# Patient Record
Sex: Male | Born: 1980 | Race: Black or African American | Hispanic: No | Marital: Single | Smoking: Never smoker
Health system: Southern US, Community
[De-identification: ages and names within clinical notes are randomized; demographics above are authoritative.]

## PROBLEM LIST (undated history)

## (undated) DIAGNOSIS — J40 Bronchitis, not specified as acute or chronic: Secondary | ICD-10-CM

---

## 2011-07-13 ENCOUNTER — Emergency Department (HOSPITAL_BASED_OUTPATIENT_CLINIC_OR_DEPARTMENT_OTHER)
Admission: EM | Admit: 2011-07-13 | Discharge: 2011-07-13 | Disposition: A | Discharge status: Home / Self Care | Payer: No Typology Code available for payment source | Attending: Emergency Medicine | Admitting: Emergency Medicine

## 2011-07-13 DIAGNOSIS — Y92410 Unspecified street and highway as the place of occurrence of the external cause: Secondary | ICD-10-CM | POA: Insufficient documentation

## 2011-07-13 DIAGNOSIS — S339XXA Sprain of unspecified parts of lumbar spine and pelvis, initial encounter: Secondary | ICD-10-CM | POA: Insufficient documentation

## 2011-07-13 DIAGNOSIS — S39012A Strain of muscle, fascia and tendon of lower back, initial encounter: Secondary | ICD-10-CM

## 2011-07-13 MED ORDER — CYCLOBENZAPRINE HCL 10 MG PO TABS: 10.0000 mg | ORAL_TABLET | Freq: Three times a day (TID) | ORAL | Status: AC | PRN

## 2011-07-13 MED ORDER — NAPROXEN 500 MG PO TABS: 500.0000 mg | ORAL_TABLET | Freq: Two times a day (BID) | ORAL | Status: AC

## 2011-07-13 MED ORDER — OXYCODONE-ACETAMINOPHEN 5-325 MG PO TABS
2.0000 | ORAL_TABLET | Freq: Once | ORAL | Status: AC
  Administered 2011-07-13: 2 via ORAL
  Filled 2011-07-13: qty 2

## 2011-07-13 MED ORDER — OXYCODONE-ACETAMINOPHEN 5-325 MG PO TABS: 1.0000 | ORAL_TABLET | Freq: Four times a day (QID) | ORAL | Status: AC | PRN

## 2011-07-13 MED ORDER — IBUPROFEN 800 MG PO TABS
800.0000 mg | ORAL_TABLET | Freq: Once | ORAL | Status: AC
  Administered 2011-07-13: 800 mg via ORAL
  Filled 2011-07-13: qty 1
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
## 2014-07-14 ENCOUNTER — Emergency Department (HOSPITAL_BASED_OUTPATIENT_CLINIC_OR_DEPARTMENT_OTHER)
Admission: EM | Admit: 2014-07-14 | Discharge: 2014-07-14 | Disposition: A | Discharge status: Home / Self Care | Payer: No Typology Code available for payment source | Attending: Emergency Medicine | Admitting: Emergency Medicine

## 2014-07-14 ENCOUNTER — Encounter (HOSPITAL_BASED_OUTPATIENT_CLINIC_OR_DEPARTMENT_OTHER): Payer: Self-pay | Admitting: *Deleted

## 2014-07-14 DIAGNOSIS — J358 Other chronic diseases of tonsils and adenoids: Secondary | ICD-10-CM

## 2014-07-14 DIAGNOSIS — H6692 Otitis media, unspecified, left ear: Secondary | ICD-10-CM | POA: Insufficient documentation

## 2014-07-14 MED ORDER — AMOXICILLIN 500 MG PO CAPS: 500.0000 mg | ORAL_CAPSULE | Freq: Three times a day (TID) | ORAL | Status: DC

## 2014-07-14 NOTE — ED Provider Notes (Signed)
CSN: [id number]     Arrival date & time 07/14/14  0941 History   First MD Initiated Contact with Patient 07/14/14 [phone]     Chief Complaint  Patient presents with  . Sore Throat     (Consider location/radiation/quality/duration/timing/severity/associated sxs/prior Treatment) Patient is a 33 y.o. male presenting with pharyngitis. The history is provided by the patient.  Sore Throat This is a new problem. The current episode started 6 to 12 hours ago. The problem occurs constantly. The problem has not changed since onset.Associated symptoms comments: Left ear pain.  No fever, cough or sinus drainage. The symptoms are aggravated by swallowing. Nothing relieves the symptoms. He has tried nothing for the symptoms. The treatment provided no relief.    History reviewed. No pertinent past medical history. History reviewed. No pertinent past surgical history. No family history on file. History  Substance Use Topics  . Smoking status: Never Smoker   . Smokeless tobacco: Never Used  . Alcohol Use: Yes     Comment: occasional    Review of Systems  All other systems reviewed and are negative.     Allergies  Review of patient's allergies indicates no known allergies.  Home Medications   Prior to Admission medications   Medication Sig Start Date End Date Taking? Authorizing Provider  amoxicillin (AMOXIL) 500 MG capsule Take 1 capsule (500 mg total) by mouth 3 (three) times daily. 07/14/14   Gwyneth SproutWhitney Quadasia Newsham, MD   BP 131/80 mmHg  Temp(Src) 98.9 F (37.2 C) (Oral)  Resp 18  Ht 5\' 10"  (1.778 m)  Wt 226 lb (102.513 kg)  BMI 32.43 kg/m2  SpO2 98% Physical Exam  Constitutional: He is oriented to person, place, and time. He appears well-developed and well-nourished. No distress.  HENT:  Head: Normocephalic and atraumatic.  Right Ear: Tympanic membrane and ear canal normal.  Left Ear: Ear canal normal. Tympanic membrane is injected, erythematous and bulging. Tympanic membrane is not  perforated. A middle ear effusion is present.  Mouth/Throat: Mucous membranes are normal. No oropharyngeal exudate, posterior oropharyngeal edema or tonsillar abscesses.    Eyes: EOM are normal. Pupils are equal, round, and reactive to light.  Cardiovascular: Normal rate.   Pulmonary/Chest: Effort normal.  Neurological: He is alert and oriented to person, place, and time.  Skin: Skin is warm and dry.  Psychiatric: He has a normal mood and affect. His behavior is normal.  Nursing note and vitals reviewed.   ED Course  Procedures (including critical care time) Labs Review Labs Reviewed - No data to display  Imaging Review No results found.   EKG Interpretation None      MDM   Final diagnoses:  Acute left otitis media, recurrence not specified, unspecified otitis media type  Symptomatic tonsillar crypt    Patient with left-sided ear and throat pain that started today. He has evidence of left otitis media with erythema of the left. Takes. Also patient has a tonsillar crypt stone that was removed with a curette.  Patient started on amoxicillin   Gwyneth SproutWhitney Yunus Stoklosa, MD 07/14/14 1009

## 2014-07-14 NOTE — ED Notes (Signed)
Sore throat, left side, since waking- noticed white spot on back of throat- also c/o left ear pain

## 2014-07-14 NOTE — ED Notes (Signed)
D/c home with RX x 1 for amoxicillin

## 2014-08-15 ENCOUNTER — Emergency Department (HOSPITAL_BASED_OUTPATIENT_CLINIC_OR_DEPARTMENT_OTHER)
Admission: EM | Admit: 2014-08-15 | Discharge: 2014-08-15 | Disposition: A | Discharge status: Home / Self Care | Payer: No Typology Code available for payment source | Attending: Emergency Medicine | Admitting: Emergency Medicine

## 2014-08-15 ENCOUNTER — Encounter (HOSPITAL_BASED_OUTPATIENT_CLINIC_OR_DEPARTMENT_OTHER): Payer: Self-pay | Admitting: Emergency Medicine

## 2014-08-15 DIAGNOSIS — S0501XA Injury of conjunctiva and corneal abrasion without foreign body, right eye, initial encounter: Secondary | ICD-10-CM

## 2014-08-15 DIAGNOSIS — Y9389 Activity, other specified: Secondary | ICD-10-CM | POA: Insufficient documentation

## 2014-08-15 DIAGNOSIS — H15001 Unspecified scleritis, right eye: Secondary | ICD-10-CM

## 2014-08-15 DIAGNOSIS — X58XXXA Exposure to other specified factors, initial encounter: Secondary | ICD-10-CM | POA: Insufficient documentation

## 2014-08-15 DIAGNOSIS — H15099 Other scleritis, unspecified eye: Secondary | ICD-10-CM | POA: Insufficient documentation

## 2014-08-15 DIAGNOSIS — Y9289 Other specified places as the place of occurrence of the external cause: Secondary | ICD-10-CM | POA: Insufficient documentation

## 2014-08-15 DIAGNOSIS — Y998 Other external cause status: Secondary | ICD-10-CM | POA: Insufficient documentation

## 2014-08-15 DIAGNOSIS — Z792 Long term (current) use of antibiotics: Secondary | ICD-10-CM | POA: Insufficient documentation

## 2014-08-15 MED ORDER — FLUORESCEIN SODIUM 1 MG OP STRP
1.0000 | ORAL_STRIP | Freq: Once | OPHTHALMIC | Status: AC
  Administered 2014-08-15: 1 via OPHTHALMIC
  Filled 2014-08-15: qty 1

## 2014-08-15 MED ORDER — ERYTHROMYCIN 5 MG/GM OP OINT: TOPICAL_OINTMENT | OPHTHALMIC | Status: DC

## 2014-08-15 MED ORDER — TETRACAINE HCL 0.5 % OP SOLN
2.0000 [drp] | Freq: Once | OPHTHALMIC | Status: AC
  Administered 2014-08-15: 2 [drp] via OPHTHALMIC
  Filled 2014-08-15: qty 2

## 2014-08-15 NOTE — ED Notes (Signed)
Right eye has been feeling "Ginger Organtrashy" for a couple days.  Not pain or itching but "irritation".  Sclera is red laterally.  No known injury.  Does not wear contacts.

## 2014-08-15 NOTE — Discharge Instructions (Signed)
Corneal Abrasion The cornea is the clear covering at the front and center of the eye. When looking at the colored portion of the eye (iris), you are looking through the cornea. This very thin tissue is made up of many layers. The surface layer is a single layer of cells (corneal epithelium) and is one of the most sensitive tissues in the body. If a scratch or injury causes the corneal epithelium to come off, it is called a corneal abrasion. If the injury extends to the tissues below the epithelium, the condition is called a corneal ulcer. CAUSES   Scratches.  Trauma.  Foreign body in the eye. Some people have recurrences of abrasions in the area of the original injury even after it has healed (recurrent erosion syndrome). Recurrent erosion syndrome generally improves and goes away with time. SYMPTOMS   Eye pain.  Difficulty or inability to keep the injured eye open.  The eye becomes very sensitive to light.  Recurrent erosions tend to happen suddenly, first thing in the morning, usually after waking up and opening the eye. DIAGNOSIS  Your health care provider can diagnose a corneal abrasion during an eye exam. Dye is usually placed in the eye using a drop or a small paper strip moistened by your tears. When the eye is examined with a special light, the abrasion shows up clearly because of the dye. TREATMENT   Small abrasions may be treated with antibiotic drops or ointment alone.  A pressure patch may be put over the eye. If this is done, follow your doctor's instructions for when to remove the patch. Do not drive or use machines while the eye patch is on. Judging distances is hard to do with a patch on. If the abrasion becomes infected and spreads to the deeper tissues of the cornea, a corneal ulcer can result. This is serious because it can cause corneal scarring. Corneal scars interfere with light passing through the cornea and cause a loss of vision in the involved eye. HOME CARE  INSTRUCTIONS  Use medicine or ointment as directed. Only take over-the-counter or prescription medicines for pain, discomfort, or fever as directed by your health care provider.  Do not drive or operate machinery if your eye is patched. Your ability to judge distances is impaired.  If your health care provider has given you a follow-up appointment, it is very important to keep that appointment. Not keeping the appointment could result in a severe eye infection or permanent loss of vision. If there is any problem keeping the appointment, let your health care provider know. SEEK MEDICAL CARE IF:   You have pain, light sensitivity, and a scratchy feeling in one eye or both eyes.  Your pressure patch keeps loosening up, and you can blink your eye under the patch after treatment.  Any kind of discharge develops from the eye after treatment or if the lids stick together in the morning.  You have the same symptoms in the morning as you did with the original abrasion days, weeks, or months after the abrasion healed. MAKE SURE YOU:   Understand these instructions.  Will watch your condition.  Will get help right away if you are not doing well or get worse. Document Released: 08/07/2000 Document Revised: 08/15/2013 Document Reviewed: 04/17/2013 [facility] Patient Information 2015 [place], [place]. This information is not intended to replace advice given to you by your health care provider. Make sure you discuss any questions you have with your health care provider.   Scleritis  and Episcleritis The outer part of the eyeball is covered with a tough fibrous covering called the sclera. It is the white part of the eye. This tough covering also has a thin membrane lying on top of it called the episclera.   When the sclera becomes red and sore (inflamed), it is called scleritis.  When the episclera becomes inflamed, it is called episcleritis. CAUSES   Scleritis is usually more severe and is associated  with autoimmune diseases such as:  Rheumatoid arthritis.  Inflammations of the bowel such as Crohn's Disease (regional enteritis).  Ulcerative colitis.  Episcleritis usually has no known cause. SYMPTOMS  Both scleritis and episcleritis cause red patches or a nodule on the eye. DIAGNOSIS  This condition should be examined by an ophthalmologist. This is because very strong medications that have side effects to the body and eye may be required to treat severe attacks. Further investigations into the patient's general health may be necessary. TREATMENT   Episcleritis tends to get better without treatment within a week or two.  Scleritis is more severe. Often, your caregiver will prescribe steroids by mouth (orally) or as drops in the eye. This treatment helps lessen the redness and soreness (inflammation). HOME CARE INSTRUCTIONS   Take all medications as directed.  Keep your follow-up appointments as directed.  Avoid irritation of the involved eye(s).  Stop using hard or soft contact lenses until your caregiver tells you that it is safe to use them. SEEK MEDICAL CARE IF:   Redness or irritation gets worse.  You develop pain or sensitivity to light.  You develop any change in vision in the involved eye(s). Document Released: 08/04/2001 Document Revised: 11/02/2011 Document Reviewed: 12/06/2008 [facility] Patient Information 2015 [place], [place]. This information is not intended to replace advice given to you by your health care provider. Make sure you discuss any questions you have with your health care provider.

## 2014-08-15 NOTE — ED Provider Notes (Signed)
CSN: [id number]     Arrival date & time 08/15/14  2202 History   This chart was scribed for Anthony CamelScott T Rane Dumm, MD by Freida Busmaniana Omoyeni, ED Scribe. This patient was seen in room MH07/MH07 and the patient's care was started 10:49 PM.    Chief Complaint  Patient presents with  . Eye Problem      The history is provided by the patient. No language interpreter was used.     HPI Comments:  Anthony Pham is a 33 y.o. male who presents to the Emergency Department complaining of irritation and erythema to his right eye for the last 2 days. He feels as if there is something in his eye but denies injury or debris to his eye. He also denies blurry vision, drainage from the eye, and use of contacts/glasses. No alleviating factors noted. 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   History reviewed. No pertinent past medical history. History reviewed. No pertinent past surgical history. No family history on file. History  Substance Use Topics  . Smoking status: Never Smoker   . Smokeless tobacco: Never Used  . Alcohol Use: Yes     Comment: occasional    Review of Systems  Constitutional: Negative for fever.  HENT: Negative for rhinorrhea.   Eyes: Positive for redness. Negative for discharge, itching and visual disturbance.  Respiratory: Negative for cough.   All other systems reviewed and are negative.     Allergies  Review of patient's allergies indicates no known allergies.  Home Medications   Prior to Admission medications   Medication Sig Start Date End Date Taking? Authorizing Provider  amoxicillin (AMOXIL) 500 MG capsule Take 1 capsule (500 mg total) by mouth 3 (three) times daily. 07/14/14   Gwyneth SproutWhitney Plunkett, MD   BP 140/90 mmHg  Pulse 73  Temp(Src) 98.5 F (36.9 C) (Oral)  Resp 16  Ht 5' 10.5" (1.791 m)  Wt 230 lb (104.327 kg)  BMI 32.52 kg/m2  SpO2 98% Physical Exam  Constitutional: He is oriented to person, place, and time. He appears  well-developed and well-nourished. No distress.  HENT:  Head: Normocephalic and atraumatic.  Eyes: EOM and lids are normal. Pupils are equal, round, and reactive to light. Lids are everted and swept, no foreign bodies found. Right eye exhibits no discharge. No foreign body present in the right eye. Left eye exhibits no discharge. Right conjunctiva is injected. Right conjunctiva has no hemorrhage. Left conjunctiva is not injected. Left conjunctiva has no hemorrhage.  Slit lamp exam:      The right eye shows corneal abrasion and fluorescein uptake.  Neck: Neck supple.  Pulmonary/Chest: Effort normal. No respiratory distress.  Abdominal: He exhibits no distension.  Neurological: He is alert and oriented to person, place, and time.  Skin: Skin is warm and dry.  Psychiatric: He has a normal mood and affect.  Nursing note and vitals reviewed.   ED Course  Procedures   DIAGNOSTIC STUDIES:  Oxygen Saturation is 98% on RA, normal by my interpretation.    COORDINATION OF CARE:  10:53 PM Discussed treatment plan with pt at bedside and pt agreed to plan.  Labs Review Labs Reviewed - No data to display  Imaging Review No results found.   EKG Interpretation None      MDM   Final diagnoses:  Corneal abrasion, right, initial encounter  Scleritis, right    She with lateral right eye inflammation/irritation. Possible injury given his type of work. Small  area of increased uptake in the mid right lateral sclera that could be consistent with an abrasion. This could be atypical viral infection as well. There is no evidence of foreign body under the lids. Will cover with erythromycin ointment and recommend follow-up with an ophthalmologist.  I personally performed the services described in this documentation, which was scribed in my presence. The recorded information has been reviewed and is accurate.    Anthony CamelScott T [name], MD 08/16/14 [phone]

## 2014-08-15 NOTE — ED Notes (Signed)
Pt ambulating independently w/ steady gait on d/c in no acute distress, A&Ox4. D/c instructions reviewed w/ pt and family - pt and family deny any further questions or concerns at present. Rx given x1  

## 2014-08-15 NOTE — ED Notes (Signed)
Pt w/ redness and irritation to rt eye x3 days - denies any injury w/ foreign body, denies drainage.

## 2016-08-07 DIAGNOSIS — R0602 Shortness of breath: Secondary | ICD-10-CM | POA: Diagnosis not present

## 2016-08-07 DIAGNOSIS — R42 Dizziness and giddiness: Secondary | ICD-10-CM | POA: Diagnosis not present

## 2016-08-07 DIAGNOSIS — R062 Wheezing: Secondary | ICD-10-CM | POA: Diagnosis not present

## 2016-08-10 DIAGNOSIS — R42 Dizziness and giddiness: Secondary | ICD-10-CM | POA: Diagnosis not present

## 2016-08-10 DIAGNOSIS — R0602 Shortness of breath: Secondary | ICD-10-CM | POA: Diagnosis not present

## 2016-08-10 DIAGNOSIS — R079 Chest pain, unspecified: Secondary | ICD-10-CM | POA: Diagnosis not present

## 2016-08-10 DIAGNOSIS — R062 Wheezing: Secondary | ICD-10-CM | POA: Diagnosis not present

## 2016-08-10 DIAGNOSIS — D72829 Elevated white blood cell count, unspecified: Secondary | ICD-10-CM | POA: Diagnosis not present

## 2016-08-14 DIAGNOSIS — R05 Cough: Secondary | ICD-10-CM | POA: Diagnosis not present

## 2016-08-14 DIAGNOSIS — J0100 Acute maxillary sinusitis, unspecified: Secondary | ICD-10-CM | POA: Diagnosis not present

## 2016-08-14 DIAGNOSIS — R079 Chest pain, unspecified: Secondary | ICD-10-CM | POA: Diagnosis not present

## 2016-08-14 DIAGNOSIS — R0602 Shortness of breath: Secondary | ICD-10-CM | POA: Diagnosis not present

## 2016-08-19 DIAGNOSIS — Z6831 Body mass index (BMI) 31.0-31.9, adult: Secondary | ICD-10-CM | POA: Diagnosis not present

## 2016-08-19 DIAGNOSIS — R0602 Shortness of breath: Secondary | ICD-10-CM | POA: Diagnosis not present

## 2016-08-19 DIAGNOSIS — J0100 Acute maxillary sinusitis, unspecified: Secondary | ICD-10-CM | POA: Diagnosis not present

## 2016-09-26 ENCOUNTER — Emergency Department (HOSPITAL_BASED_OUTPATIENT_CLINIC_OR_DEPARTMENT_OTHER)
Admission: EM | Admit: 2016-09-26 | Discharge: 2016-09-26 | Disposition: A | Discharge status: Home / Self Care | Payer: BLUE CROSS/BLUE SHIELD | Attending: Emergency Medicine | Admitting: Emergency Medicine

## 2016-09-26 ENCOUNTER — Encounter (HOSPITAL_BASED_OUTPATIENT_CLINIC_OR_DEPARTMENT_OTHER): Payer: Self-pay

## 2016-09-26 DIAGNOSIS — K029 Dental caries, unspecified: Secondary | ICD-10-CM | POA: Diagnosis not present

## 2016-09-26 DIAGNOSIS — K047 Periapical abscess without sinus: Secondary | ICD-10-CM | POA: Insufficient documentation

## 2016-09-26 DIAGNOSIS — K0889 Other specified disorders of teeth and supporting structures: Secondary | ICD-10-CM | POA: Diagnosis not present

## 2016-09-26 MED ORDER — IBUPROFEN 800 MG PO TABS: 800.0000 mg | ORAL_TABLET | Freq: Three times a day (TID) | ORAL | 0 refills | Status: DC | PRN

## 2016-09-26 MED ORDER — TRAMADOL HCL 50 MG PO TABS: 50.0000 mg | ORAL_TABLET | Freq: Four times a day (QID) | ORAL | 0 refills | Status: DC | PRN

## 2016-09-26 MED ORDER — PENICILLIN V POTASSIUM 500 MG PO TABS: 500.0000 mg | ORAL_TABLET | Freq: Four times a day (QID) | ORAL | 0 refills | Status: DC

## 2016-09-26 NOTE — ED Triage Notes (Signed)
Pt reports right and left upper dental pain with some swelling. Pt does have a dentist that he normally sees.

## 2016-09-26 NOTE — Discharge Instructions (Signed)
Return here as needed.  Follow-up with the oral surgeon provided or a dentist.  Rinse with warm water and peroxide 3 times a day

## 2016-09-26 NOTE — ED Notes (Signed)
Pt noted to be eating prior to triage.

## 2016-09-26 NOTE — ED Provider Notes (Signed)
[facility]   CSN: [id number] Arrival date & time: 09/26/16  1121     History   Chief Complaint Chief Complaint  Patient presents with  . Dental Pain    HPI Anthony Pham is a 36 y.o. male.  HPI Patient presents to the emergency department with a one-day history of left upper gum pain and swelling along with some right upper gum pain and swelling.  The area where the swelling occurs there is damaged and decayed teeth and states that he did not take any medications prior to arrival.  He states that he did rinse his mouth with mouthwash several times yesterday in attempt to alleviate the symptoms.  She states that this morning the swelling seemed worse and is brought into the emergency department.  The patient states that nothing seems make the condition better.  Patient denies chest pain, shortness of breath, neck swelling, Throat swelling, difficulty breathing, difficulty swallowing, headache, blurred vision, near syncope or syncope.  The patient also denies any nausea, vomiting.  History reviewed. No pertinent past medical history.  There are no active problems to display for this patient.   History reviewed. No pertinent surgical history.     Home Medications    Prior to Admission medications   Medication Sig Start Date End Date Taking? Authorizing Provider  amoxicillin (AMOXIL) 500 MG capsule Take 1 capsule (500 mg total) by mouth 3 (three) times daily. 07/14/14   Gwyneth SproutWhitney Plunkett, MD  erythromycin ophthalmic ointment Place a 1/2 inch ribbon of ointment into the lower eyelid 2-4 times per day 08/15/14   Pricilla LovelessScott Goldston, MD    Family History No family history on file.  Social History Social History  Substance Use Topics  . Smoking status: Never Smoker  . Smokeless tobacco: Never Used  . Alcohol use Yes     Comment: occasional     Allergies   Patient has no known allergies.   Review of Systems Review of Systems All other systems  negative except as documented in the HPI. All pertinent positives and negatives as reviewed in the HPI.  Physical Exam Updated Vital Signs BP (!) 153/110 (BP Location: Left Arm) Comment: RN ashley notified  Pulse 67   Temp 98.4 F (36.9 C) (Oral)   Resp 19   Ht 5\' 11"  (1.803 m)   Wt 102.1 kg   SpO2 98%   BMI 31.38 kg/m   Physical Exam  Constitutional: He is oriented to person, place, and time. He appears well-developed and well-nourished. No distress.  HENT:  Head: Normocephalic and atraumatic.  Mouth/Throat:    Eyes: Pupils are equal, round, and reactive to light.  Pulmonary/Chest: Effort normal.  Neurological: He is alert and oriented to person, place, and time.  Skin: Skin is warm and dry.  Psychiatric: He has a normal mood and affect.  Nursing note and vitals reviewed.    ED Treatments / Results  Labs (all labs ordered are listed, but only abnormal results are displayed) Labs Reviewed - No data to display  EKG  EKG Interpretation None       Radiology No results found.  Procedures Procedures (including critical care time)  Medications Ordered in ED Medications - No data to display   Initial Impression / Assessment and Plan / ED Course  I have reviewed the triage vital signs and the nursing notes.  Pertinent labs & imaging results that were available during my care of the patient were reviewed by me and considered in  my medical decision making (see chart for details).     Patient be treated for dental abscess, referred to oral surgery.  Told to return here as needed.  Referral to oral surgery is due to the fact that he has multiple broken off teeth underneath the gumline with swelling and abscess  Final Clinical Impressions(s) / ED Diagnoses   Final diagnoses:  None    New Prescriptions New Prescriptions   No medications on file     Charlestine Night, PA-C 09/26/16 1203    Geoffery Lyons, MD 09/26/16 [phone]

## 2016-11-11 DIAGNOSIS — H04123 Dry eye syndrome of bilateral lacrimal glands: Secondary | ICD-10-CM | POA: Diagnosis not present

## 2016-11-11 DIAGNOSIS — H52203 Unspecified astigmatism, bilateral: Secondary | ICD-10-CM | POA: Diagnosis not present

## 2016-11-11 DIAGNOSIS — H5213 Myopia, bilateral: Secondary | ICD-10-CM | POA: Diagnosis not present

## 2016-12-06 ENCOUNTER — Emergency Department (HOSPITAL_BASED_OUTPATIENT_CLINIC_OR_DEPARTMENT_OTHER): Payer: BLUE CROSS/BLUE SHIELD

## 2016-12-06 ENCOUNTER — Emergency Department (HOSPITAL_BASED_OUTPATIENT_CLINIC_OR_DEPARTMENT_OTHER)
Admission: EM | Admit: 2016-12-06 | Discharge: 2016-12-06 | Disposition: A | Discharge status: Home / Self Care | Payer: BLUE CROSS/BLUE SHIELD | Attending: Emergency Medicine | Admitting: Emergency Medicine

## 2016-12-06 ENCOUNTER — Encounter (HOSPITAL_BASED_OUTPATIENT_CLINIC_OR_DEPARTMENT_OTHER): Payer: Self-pay | Admitting: Emergency Medicine

## 2016-12-06 DIAGNOSIS — Z791 Long term (current) use of non-steroidal anti-inflammatories (NSAID): Secondary | ICD-10-CM | POA: Insufficient documentation

## 2016-12-06 DIAGNOSIS — R0789 Other chest pain: Secondary | ICD-10-CM | POA: Diagnosis not present

## 2016-12-06 DIAGNOSIS — K0889 Other specified disorders of teeth and supporting structures: Secondary | ICD-10-CM | POA: Insufficient documentation

## 2016-12-06 DIAGNOSIS — R079 Chest pain, unspecified: Secondary | ICD-10-CM | POA: Diagnosis not present

## 2016-12-06 MED ORDER — PENICILLIN V POTASSIUM 500 MG PO TABS: 1000.0000 mg | ORAL_TABLET | Freq: Two times a day (BID) | ORAL | 0 refills | Status: DC

## 2016-12-06 MED ORDER — ACETAMINOPHEN 500 MG PO TABS
1000.0000 mg | ORAL_TABLET | Freq: Once | ORAL | Status: AC
  Administered 2016-12-06: 1000 mg via ORAL
  Filled 2016-12-06: qty 2

## 2016-12-06 MED ORDER — IBUPROFEN 800 MG PO TABS
800.0000 mg | ORAL_TABLET | Freq: Once | ORAL | Status: AC
  Administered 2016-12-06: 800 mg via ORAL
  Filled 2016-12-06: qty 1

## 2016-12-06 MED ORDER — OXYCODONE HCL 5 MG PO TABS: 5.0000 mg | ORAL_TABLET | Freq: Once | ORAL | Status: DC

## 2016-12-06 MED ORDER — BUPIVACAINE-EPINEPHRINE (PF) 0.5% -1:200000 IJ SOLN
1.8000 mL | Freq: Once | INTRAMUSCULAR | Status: AC
  Administered 2016-12-06: 1.8 mL
  Filled 2016-12-06: qty 1.8

## 2016-12-06 NOTE — ED Provider Notes (Signed)
[facility] Provider Note   CSN: [id number] Arrival date & time: 12/06/16  0901     History   Chief Complaint Chief Complaint  Patient presents with  . Dental Pain  . Chest Pain    HPI Anthony Pham is a 36 y.o. male.  36 yo M with a chief complaint of left lower dental pain. This been going on for 3 or 4 days. Subjective fever last night. Worse with palpation. Patient is also complaining of epigastric pain. Has been pretty persistent for the past couple days. Nonexertional. Improves with eating. Worse when he doesn't eat for long periods of time. Denies lower extremity edema denies testosterone use. Denies history of PE or DVT. Denies recent hospitalization or hemoptysis.   The history is provided by the patient.  Dental Pain    Chest Pain   Pertinent negatives include no abdominal pain, no fever, no headaches, no palpitations, no shortness of breath and no vomiting.  Illness  This is a new problem. The current episode started 2 days ago. The problem occurs constantly. The problem has not changed since onset.Associated symptoms include chest pain. Pertinent negatives include no abdominal pain, no headaches and no shortness of breath. Nothing aggravates the symptoms. The symptoms are relieved by eating. He has tried nothing for the symptoms. The treatment provided no relief.    History reviewed. No pertinent past medical history.  There are no active problems to display for this patient.   History reviewed. No pertinent surgical history.     Home Medications    Prior to Admission medications   Medication Sig Start Date End Date Taking? Authorizing Provider  amoxicillin (AMOXIL) 500 MG capsule Take 1 capsule (500 mg total) by mouth 3 (three) times daily. 07/14/14   Gwyneth Sprout, MD  erythromycin ophthalmic ointment Place a 1/2 inch ribbon of ointment into the lower eyelid 2-4 times per day 08/15/14   Pricilla Loveless, MD  ibuprofen (ADVIL,MOTRIN) 800 MG  tablet Take 1 tablet (800 mg total) by mouth every 8 (eight) hours as needed. 09/26/16   Charlestine Night, PA-C  penicillin v potassium (VEETID) 500 MG tablet Take 2 tablets (1,000 mg total) by mouth 2 (two) times daily. X 7 days 12/06/16   Melene Plan, DO  traMADol (ULTRAM) 50 MG tablet Take 1 tablet (50 mg total) by mouth every 6 (six) hours as needed for severe pain. 09/26/16   Charlestine Night, PA-C    Family History No family history on file.  Social History Social History  Substance Use Topics  . Smoking status: Never Smoker  . Smokeless tobacco: Never Used  . Alcohol use Yes     Comment: occasional     Allergies   Patient has no known allergies.   Review of Systems Review of Systems  Constitutional: Negative for chills and fever.  HENT: Positive for dental problem. Negative for congestion and facial swelling.   Eyes: Negative for discharge and visual disturbance.  Respiratory: Negative for shortness of breath.   Cardiovascular: Positive for chest pain. Negative for palpitations.  Gastrointestinal: Negative for abdominal pain, diarrhea and vomiting.  Musculoskeletal: Negative for arthralgias and myalgias.  Skin: Negative for color change and rash.  Neurological: Negative for tremors, syncope and headaches.  Psychiatric/Behavioral: Negative for confusion and dysphoric mood.     Physical Exam Updated Vital Signs BP (!) 143/95 (BP Location: Left Arm)   Pulse 61   Temp 98.4 F (36.9 C) (Oral)   Resp 18   Ht 5'  11" (1.803 m)   Wt 230 lb (104.3 kg)   SpO2 96%   BMI 32.08 kg/m   Physical Exam  Constitutional: He is oriented to person, place, and time. He appears well-developed and well-nourished.  HENT:  Head: Normocephalic and atraumatic.  No noted erythema.  Mild tenderness to percussion to the left third molar.  Eyes: EOM are normal. Pupils are equal, round, and reactive to light.  Neck: Normal range of motion. Neck supple. No JVD present.  Cardiovascular:  Normal rate and regular rhythm.  Exam reveals no gallop and no friction rub.   No murmur heard. Pulmonary/Chest: No respiratory distress. He has no wheezes. He has no rales. He exhibits no tenderness.  Abdominal: He exhibits no distension and no mass. There is no tenderness. There is no rebound and no guarding.  Musculoskeletal: Normal range of motion.  Neurological: He is alert and oriented to person, place, and time.  Skin: No rash noted. No pallor.  Psychiatric: He has a normal mood and affect. His behavior is normal.  Nursing note and vitals reviewed.    ED Treatments / Results  Labs (all labs ordered are listed, but only abnormal results are displayed) Labs Reviewed - No data to display  EKG  EKG Interpretation  Date/Time:   yo M With a chief complaint of dental pain and chest pain. Chest pain sounds gastric in nature. Improves with eating. No history of MI in the family. PERC negative.  EKG with no significant finding. Will obtain a chest x-ray. Dental block at bedside. Dental pain with no signs of infection. Will cover with penicillin. He sees his dentist tomorrow.  On reassessment patient declining dental block.  D/c home.   10:22 AM:  I have discussed the diagnosis/risks/treatment options with the patient and believe the pt to be eligible for discharge home to follow-up with Dentist, PCP. We also discussed returning to the ED immediately if new or worsening sx occur. We discussed the sx which are  most concerning (e.g., sudden worsening pain, fever, inability to tolerate by mouth) that necessitate immediate return. Medications administered to the patient during their visit and any new prescriptions provided to the patient are listed below.  Medications given during this visit Medications  oxyCODONE (Oxy IR/ROXICODONE) immediate release tablet 5 mg (5 mg Oral Not Given 12/06/16 0949)  acetaminophen (TYLENOL) tablet 1,000 mg (1,000 mg Oral Given 12/06/16 0948)  ibuprofen (ADVIL,MOTRIN) tablet 800 mg (800 mg Oral Given 12/06/16 0948)  bupivacaine-epinephrine (MARCAINE W/ EPI) 0.5% -1:200000 injection 1.8 mL (1.8 mLs Infiltration Given by Other 12/06/16 5284)     The patient appears reasonably screen and/or stabilized for discharge and I doubt any other medical condition or other [facility] requiring further screening, evaluation, or treatment in the ED at this time prior to discharge.    Final Clinical Impressions(s) / ED Diagnoses    Final diagnoses:  Pain, dental  Atypical chest pain    New Prescriptions New Prescriptions   PENICILLIN V POTASSIUM (VEETID) 500 MG TABLET    Take 2 tablets (1,000 mg total) by mouth 2 (two) times daily. X 7 days     Melene Plan, DO 12/06/16 1022

## 2016-12-06 NOTE — ED Triage Notes (Signed)
L lower dental pain for several weeks. Also reports centralized chest pressure for several days, denies SOB, N/V

## 2016-12-06 NOTE — Discharge Instructions (Signed)
Try zantac  twice a day.   See your dentist tomorrow!  Take 4 over the counter ibuprofen tablets 3 times a day or 2 over-the-counter naproxen tablets twice a day for pain. Also take tylenol (2 extra strength) four times a day.

## 2016-12-20 ENCOUNTER — Emergency Department (HOSPITAL_BASED_OUTPATIENT_CLINIC_OR_DEPARTMENT_OTHER)
Admission: EM | Admit: 2016-12-20 | Discharge: 2016-12-20 | Disposition: A | Discharge status: Home / Self Care | Payer: BLUE CROSS/BLUE SHIELD | Attending: Emergency Medicine | Admitting: Emergency Medicine

## 2016-12-20 ENCOUNTER — Encounter (HOSPITAL_BASED_OUTPATIENT_CLINIC_OR_DEPARTMENT_OTHER): Payer: Self-pay | Admitting: Emergency Medicine

## 2016-12-20 DIAGNOSIS — X58XXXA Exposure to other specified factors, initial encounter: Secondary | ICD-10-CM | POA: Diagnosis not present

## 2016-12-20 DIAGNOSIS — Y9367 Activity, basketball: Secondary | ICD-10-CM | POA: Insufficient documentation

## 2016-12-20 DIAGNOSIS — Y998 Other external cause status: Secondary | ICD-10-CM | POA: Insufficient documentation

## 2016-12-20 DIAGNOSIS — S3992XA Unspecified injury of lower back, initial encounter: Secondary | ICD-10-CM | POA: Diagnosis present

## 2016-12-20 DIAGNOSIS — M545 Low back pain, unspecified: Secondary | ICD-10-CM

## 2016-12-20 DIAGNOSIS — Y929 Unspecified place or not applicable: Secondary | ICD-10-CM | POA: Insufficient documentation

## 2016-12-20 MED ORDER — METHOCARBAMOL 500 MG PO TABS: 500.0000 mg | ORAL_TABLET | Freq: Three times a day (TID) | ORAL | 0 refills | Status: AC | PRN

## 2016-12-20 NOTE — Discharge Instructions (Signed)

## 2016-12-20 NOTE — ED Provider Notes (Signed)
[facility]   CSN: [id number] Arrival date & time: 12/20/16  2011  By signing my name below, I, Anthony Pham, attest that this documentation has been prepared under the direction and in the presence of [facility], PA-C. Electronically Signed: Cynda Pham, Scribe. 12/20/16. 9:12 PM.  History   Chief Complaint Chief Complaint  Patient presents with  . Back Pain   HPI Comments: Anthony Pham is a 36 y.o. male with no pertinent medical history, who presents to the Emergency Department complaining of sudden-onset, intermittent lower back pain that began earlier today. Patient states he was playing basketball when he jumped up and landed on his feet with immediate onset of bilateral low back pain. He has tried methocarbamol, applying bio-freeze, and soaking in an epsom salt bath with some relief. Patient states the right lower back is sharp, where as the left lower back is dull feeling. Pain does not radiate down the legs. Patient states sitting up makes his pain worse. Patient is ambulatory in the emergency department. Patient denies any numbness, weakness, tingling, loss of bowel/bladder control, nausea, vomiting, fever, chills, shortness of breath, chest pain, dysuria, or hematuria.   The history is provided by the patient. No language interpreter was used.    History reviewed. No pertinent past medical history.  There are no active problems to display for this patient.   History reviewed. No pertinent surgical history.     Home Medications    Prior to Admission medications   Medication Sig Start Date End Date Taking? Authorizing Provider  amoxicillin (AMOXIL) 500 MG capsule Take 1 capsule (500 mg total) by mouth 3 (three) times daily. 07/14/14   Gwyneth Sprout, MD  erythromycin ophthalmic ointment Place a 1/2 inch ribbon of ointment into the lower eyelid 2-4 times per day 08/15/14   Pricilla Loveless, MD  ibuprofen (ADVIL,MOTRIN) 800 MG tablet Take 1  tablet (800 mg total) by mouth every 8 (eight) hours as needed. 09/26/16   Charlestine Night, PA-C  methocarbamol (ROBAXIN) 500 MG tablet Take 1 tablet (500 mg total) by mouth every 8 (eight) hours as needed for muscle spasms. 12/20/16 12/25/16  Lonza Shimabukuro A Bridger Pizzi, PA-C  penicillin v potassium (VEETID) 500 MG tablet Take 2 tablets (1,000 mg total) by mouth 2 (two) times daily. X 7 days 12/06/16   Melene Plan, DO  traMADol (ULTRAM) 50 MG tablet Take 1 tablet (50 mg total) by mouth every 6 (six) hours as needed for severe pain. 09/26/16   Charlestine Night, PA-C    Family History History reviewed. No pertinent family history.  Social History Social History  Substance Use Topics  . Smoking status: Never Smoker  . Smokeless tobacco: Never Used  . Alcohol use Yes     Comment: occasional     Allergies   Patient has no known allergies.   Review of Systems Review of Systems  Constitutional: Negative for chills and fever.  Respiratory: Negative for shortness of breath.   Cardiovascular: Negative for chest pain.  Gastrointestinal: Negative for abdominal pain, nausea and vomiting.  Genitourinary: Negative for dysuria and hematuria.  Musculoskeletal: Positive for back pain. Negative for gait problem and neck pain.  Neurological: Negative for weakness and numbness.  All other systems reviewed and are negative.    Physical Exam Updated Vital Signs BP 119/86 (BP Location: Right Arm)   Pulse 66   Temp 98.2 F (36.8 C) (Oral)   Resp 19   Ht  (1.803 m)   Wt 104.3  kg   SpO2 97%   BMI 32.08 kg/m   Physical Exam  Constitutional: He is oriented to person, place, and time. He appears well-developed and well-nourished. No distress.  HENT:  Head: Normocephalic and atraumatic.  Eyes: Conjunctivae and EOM are normal. Pupils are equal, round, and reactive to light. Right eye exhibits no discharge. Left eye exhibits no discharge. No scleral icterus.  Neck: Normal range of motion. Neck supple. No JVD  present. No tracheal deviation present.  No midline CSP tenderness to palpation, no paraspinal muscle tenderness, no deformity or crepitus noted  Cardiovascular: Normal rate, regular rhythm, normal heart sounds and intact distal pulses.   2+ radial and DP/PT pulses bl, negative Homan's bl   Pulmonary/Chest: Effort normal and breath sounds normal. No stridor.  Abdominal: Soft. Bowel sounds are normal.  Musculoskeletal: Normal range of motion. He exhibits tenderness. He exhibits no edema or deformity.  No midline spine TTP. No paraspinal muscle tenderness. No deformity, crepitus, or stepoff noted. Limited ROM with flexion but normal extension and lateral rotation. 5/5 strength of BUE and BLE.  Neurological: He is alert and oriented to person, place, and time.  Fluent speech, no facial droop, sensation intact globally, antalgic gait, but patient able to heel walk and toe walk without difficulty.   Skin: Skin is warm and dry. Capillary refill takes less than 2 seconds. He is not diaphoretic.  Psychiatric: He has a normal mood and affect. His behavior is normal.  Nursing note and vitals reviewed.    ED Treatments / Results  DIAGNOSTIC STUDIES: Oxygen Saturation is 96% on RA, adequate by my interpretation.    COORDINATION OF CARE: 9:10 PM Discussed treatment plan with pt at bedside and pt agreed to plan, which includes a muscle relaxant, ibuprofen, tylenol, applying heat/ice, and exercising.    Labs (all labs ordered are listed, but only abnormal results are displayed) Labs Reviewed - No data to display  EKG  EKG Interpretation None       Radiology No results found.  Procedures Procedures (including critical care time)  Medications Ordered in ED Medications - No data to display   Initial Impression / Assessment and Plan / ED Course  I have reviewed the triage vital signs and the nursing notes.  Pertinent labs & imaging results that were available during my care of the  patient were reviewed by me and considered in my medical decision making (see chart for details).     Patient with back pain after playing basketball earlier today. No head injury, no falls, no loss of consciousness. Patient afebrile, vital signs are stable. No neurological deficits and normal neuro exam. Antalgic gait, due to apprehension of muscle spasms, but patient otherwise ambulatory in ED.  No loss of bowel or bladder control.  No concern for cauda equina. Improved with use of muscle relaxer at home. Likely myofascial in nature with some component of muscle spasm. RICE protocol, heat, ice, gentle stretching, and muscle relaxant indicated and discussed with patient. Recommend follow-up with primary care in the next week or so if symptoms do not improve. Discussed strict ED return precautions. Pt verbalized understanding of and agreement with plan and is safe for discharge home at this time.   Final Clinical Impressions(s) / ED Diagnoses   Final diagnoses:  Acute bilateral low back pain without sciatica    New Prescriptions Discharge Medication List as of 12/20/2016  9:21 PM    START taking these medications   Details  methocarbamol (ROBAXIN) 500  MG tablet Take 1 tablet (500 mg total) by mouth every 8 (eight) hours as needed for muscle spasms., Starting Sun 12/20/2016, Until Fri 12/25/2016, Print      I personally performed the services described in this documentation, which was scribed in my presence. The recorded information has been reviewed and is accurate.     Jeanie Sewer, PA-C 12/21/16 1109    Gwyneth Sprout, MD 12/23/16 1114

## 2016-12-20 NOTE — ED Triage Notes (Addendum)
Reports back pain which started after playing basketball today. Reports he jumped up and when he landed he feels like he injured his back with pain in the right lower back.

## 2016-12-20 NOTE — ED Notes (Signed)
ED Provider at bedside. 

## 2018-02-26 ENCOUNTER — Emergency Department (HOSPITAL_BASED_OUTPATIENT_CLINIC_OR_DEPARTMENT_OTHER): Payer: BLUE CROSS/BLUE SHIELD

## 2018-02-26 ENCOUNTER — Other Ambulatory Visit: Payer: Self-pay

## 2018-02-26 ENCOUNTER — Emergency Department (HOSPITAL_BASED_OUTPATIENT_CLINIC_OR_DEPARTMENT_OTHER)
Admission: EM | Admit: 2018-02-26 | Discharge: 2018-02-26 | Disposition: A | Discharge status: Home / Self Care | Payer: BLUE CROSS/BLUE SHIELD | Attending: Emergency Medicine | Admitting: Emergency Medicine

## 2018-02-26 ENCOUNTER — Encounter (HOSPITAL_BASED_OUTPATIENT_CLINIC_OR_DEPARTMENT_OTHER): Payer: Self-pay | Admitting: Emergency Medicine

## 2018-02-26 DIAGNOSIS — Z79899 Other long term (current) drug therapy: Secondary | ICD-10-CM | POA: Insufficient documentation

## 2018-02-26 DIAGNOSIS — M25562 Pain in left knee: Secondary | ICD-10-CM | POA: Diagnosis not present

## 2018-02-26 DIAGNOSIS — S8992XA Unspecified injury of left lower leg, initial encounter: Secondary | ICD-10-CM | POA: Diagnosis not present

## 2018-02-26 MED ORDER — IBUPROFEN 400 MG PO TABS
400.0000 mg | ORAL_TABLET | Freq: Once | ORAL | Status: AC
  Administered 2018-02-26: 400 mg via ORAL
  Filled 2018-02-26: qty 1

## 2018-02-26 NOTE — ED Provider Notes (Signed)
[facility] EMERGENCY DEPARTMENT Provider Note   CSN: [id number] Arrival date & time: 02/26/18  1227     History   Chief Complaint Chief Complaint  Patient presents with  . Knee Pain    HPI Anthony Pham is a 37 y.o. male   The history is provided by the patient.  Knee Pain   This is a new (1 week) problem. The problem occurs constantly. The problem has not changed since onset.The pain is present in the left knee. The pain is moderate. Associated symptoms include stiffness. Pertinent negatives include no numbness and no tingling. The symptoms are aggravated by activity.  Patient states that he dropped a steel clamp at work on top of his left knee, just above the patella 1 week ago.  Patient states that he has had pain that he describes as aching since then.  Patient admits to some clicking in his knee at work, he states that he is very active at work having to jump and move a lot.  History reviewed. No pertinent past medical history.  There are no active problems to display for this patient.   History reviewed. No pertinent surgical history.      Home Medications    Prior to Admission medications   Medication Sig Start Date End Date Taking? Authorizing Provider  amoxicillin (AMOXIL) 500 MG capsule Take 1 capsule (500 mg total) by mouth 3 (three) times daily. 07/14/14   Gwyneth Sprout, MD  erythromycin ophthalmic ointment Place a 1/2 inch ribbon of ointment into the lower eyelid 2-4 times per day 08/15/14   Pricilla Loveless, MD  ibuprofen (ADVIL,MOTRIN) 800 MG tablet Take 1 tablet (800 mg total) by mouth every 8 (eight) hours as needed. 09/26/16   Lawyer, Cristal Deer, PA-C  penicillin v potassium (VEETID) 500 MG tablet Take 2 tablets (1,000 mg total) by mouth 2 (two) times daily. X 7 days 12/06/16   Melene Plan, DO  traMADol (ULTRAM) 50 MG tablet Take 1 tablet (50 mg total) by mouth every 6 (six) hours as needed for severe pain. 09/26/16   Charlestine Night, PA-C     Family History No family history on file.  Social History Social History   Tobacco Use  . Smoking status: Never Smoker  . Smokeless tobacco: Never Used  Substance Use Topics  . Alcohol use: Yes    Comment: occasional  . Drug use: No     Allergies   Patient has no known allergies.   Review of Systems Review of Systems  Constitutional: Negative.  Negative for chills, fatigue and fever.  HENT: Negative.  Negative for congestion, ear pain, rhinorrhea, sore throat and trouble swallowing.   Eyes: Negative.  Negative for visual disturbance.  Respiratory: Negative.  Negative for cough, chest tightness and shortness of breath.   Cardiovascular: Negative.  Negative for chest pain and leg swelling.  Gastrointestinal: Negative.  Negative for abdominal pain, blood in stool, diarrhea, nausea and vomiting.  Genitourinary: Negative.  Negative for dysuria, flank pain and hematuria.  Musculoskeletal: Positive for arthralgias, joint swelling and stiffness. Negative for myalgias and neck pain.  Skin: Negative.  Negative for rash.  Neurological: Negative.  Negative for dizziness, tingling, syncope, weakness, light-headedness, numbness and headaches.     Physical Exam Updated Vital Signs BP (!) 139/97 (BP Location: Left Arm)   Pulse 74   Temp 97.8 F (36.6 C) (Oral)   Resp 20   Ht 5\' 11"  (1.803 m)   Wt 108.9 kg (240 lb)  SpO2 100%   BMI 33.47 kg/m   Physical Exam  Constitutional: He is oriented to person, place, and time. He appears well-developed and well-nourished. No distress.  HENT:  Head: Normocephalic and atraumatic.  Eyes: Pupils are equal, round, and reactive to light.  Neck: Normal range of motion. Neck supple.  Cardiovascular: Normal rate and regular rhythm.  Pulmonary/Chest: Effort normal and breath sounds normal. No respiratory distress.  Abdominal: Soft. There is no tenderness.  Musculoskeletal:       Right knee: Normal.       Left knee: He exhibits no  swelling, no deformity, no laceration and no erythema. Tenderness found. Medial joint line tenderness noted.       Right lower leg: Normal. He exhibits no tenderness, no bony tenderness, no swelling and no edema.       Left lower leg: Normal. He exhibits no tenderness, no bony tenderness, no swelling and no edema.  Patient neurovascularly intact to his lower extremites bilaterally.  Patient with mild tenderness to palpation of the left knee's medial joint line and superior to the patella.  No swelling or deformity or bruising noted. McMurray's test negative. Left knee is not warm, red, or swollen. Patient with no difficulty with straight leg raise, patella tendon intact.  Neurological: He is alert and oriented to person, place, and time.  Skin: Skin is warm and dry.  Psychiatric: He has a normal mood and affect. His behavior is normal.     ED Treatments / Results  Labs (all labs ordered are listed, but only abnormal results are displayed) Labs Reviewed - No data to display  EKG None  Radiology Dg Knee Complete 4 Views Left  Result Date: 02/26/2018 CLINICAL DATA:  Anterior left knee pain after injury 1 week prior EXAM: LEFT KNEE - COMPLETE 4+ VIEW COMPARISON:  None. FINDINGS: No evidence of fracture, dislocation, or joint effusion. No evidence of arthropathy or other focal bone abnormality. Soft tissues are unremarkable. IMPRESSION: Negative. Electronically Signed   By: Delbert PhenixJason A Poff M.D.   On: 02/26/2018 13:03    Procedures Procedures (including critical care time)  Medications Ordered in ED Medications  ibuprofen (ADVIL,MOTRIN) tablet 400 mg (400 mg Oral Given 02/26/18 1320)     Initial Impression / Assessment and Plan / ED Course  I have reviewed the triage vital signs and the nursing notes.  Pertinent labs & imaging results that were available during my care of the patient were reviewed by me and considered in my medical decision making (see chart for details).   Patient  presenting for left knee pain for 1 week after dropping a steel clamp on left knee.  X-ray negative for acute fractures.  Physical exam unremarkable, no swelling or deformity noted.  Patient with mild tenderness of the left knee, treated with anti-inflammatories in department.  Patient given knee sleeve and instructed on rest ice compression and elevation therapy.  No signs or symptoms suggesting DVT.  Afebrile, exam unremarkable no signs suggesting septic joint.  Patient given referral to sports medicine doctor for follow-up.  Patient states that he will follow-up with Dr. Pearletha ForgeHudnall, Sports Medicine.   At this time there does not appear to be any evidence of an acute emergency medical condition and the patient appears stable for discharge with appropriate outpatient follow up. Diagnosis was discussed with patient who verbalizes understanding and is agreeable to discharge. I have discussed return precautions with patient who verbalizes understanding of return precautions. Patient strongly encouraged to follow-up with their  PCP.  Patient's case discussed with Buel Ream PA-C and Dr. Deretha Emory who agrees with plan to discharge with follow-up.     This note was dictated using DragonOne dictation software; please contact for any inconsistencies within the note.   Final Clinical Impressions(s) / ED Diagnoses   Final diagnoses:  Acute pain of left knee    ED Discharge Orders    None       Elizabeth Palau 02/26/18 1909    Vanetta Mulders, MD 03/02/18 [phone]

## 2018-02-26 NOTE — Discharge Instructions (Addendum)
Your blood pressure was elevated today, please follow-up with your primary care provider for management. Return to the emergency department for any new or worsening symptoms. Please follow-up with Dr. Pearletha ForgeHudnall, Sports Medicine, concerning your visit today. You may take over-the-counter anti-inflammatory medication such as ibuprofen as needed for pain.  Contact a doctor if: The pain does not stop. The pain changes or gets worse. You have a fever along with knee pain. Your knee gives out or locks up. Your knee swells, and becomes worse. Get help right away if: Your knee feels warm. You cannot move your knee. You have very bad knee pain. You have chest pain. You have trouble breathing.

## 2018-02-26 NOTE — ED Triage Notes (Signed)
Swelling and pressure to left knee since left week. States a steel clamp dropped on his knee

## 2018-03-08 ENCOUNTER — Encounter: Payer: Self-pay | Admitting: Family Medicine

## 2018-03-08 ENCOUNTER — Ambulatory Visit (INDEPENDENT_AMBULATORY_CARE_PROVIDER_SITE_OTHER): Payer: BLUE CROSS/BLUE SHIELD | Admitting: Family Medicine

## 2018-03-08 DIAGNOSIS — M25562 Pain in left knee: Secondary | ICD-10-CM | POA: Diagnosis not present

## 2018-03-08 MED ORDER — DICLOFENAC SODIUM 75 MG PO TBEC: 75.0000 mg | DELAYED_RELEASE_TABLET | Freq: Two times a day (BID) | ORAL | 1 refills | Status: DC

## 2018-03-08 NOTE — Assessment & Plan Note (Signed)
patient's knee exam is reassuring.  Consistent with synovitis and effusion.  Icing, compression, elevation.  Voltaren twice a day with food.  Consider injection or aspiration/injection if not improving.  F/u in 1 month.  Avoid squats, lunges, leg press, plyometrics, twisting maneuvers.

## 2018-03-08 NOTE — Patient Instructions (Signed)
You have a knee effusion with synovitis (inflammation of the lining of the joint). Icing 15 minutes at a time 3-4 times a day. Elevate above your heart as much as possible. Knee sleeve for compression to help with the swelling when up and walking around. Aleve 2 tabs twice a day with food OR prescription voltaren 75mg  twice a day with food. Consider injection - let us know if you want to do this today and we will. Follow up with me in 1 month. Try to avoid squats, lunges, leg press, plyometrics, twisting maneuvers until I see you back.

## 2018-03-08 NOTE — Progress Notes (Signed)
PCP: Heron NayYoung, Lauren E, PA  Subjective:   HPI: Patient is a 37 y.o. male here for left knee pain.  Patient reports about 3 weeks ago he had a steel clamp fall on the superior aspect of his left knee. 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 Pain currently is a 3-4/10 level, more tight with flexion. Feels like a pressure behind his left knee. Did hyperextend this in 9th grade and required immobilizer but his improved. Tried sleeve, tiger balm, ibuprofen. No skin changes, numbness.  History reviewed. No pertinent past medical history.  No current outpatient medications on file prior to visit.   No current facility-administered medications on file prior to visit.     History reviewed. No pertinent surgical history.  No Known Allergies  Social History   Socioeconomic History  . Marital status: Single    Spouse name: Not on file  . Number of children: Not on file  . Years of education: Not on file  . Highest education level: Not on file  Occupational History  . Not on file  Social Needs  . Financial resource strain: Not on file  . Food insecurity:    Worry: Not on file    Inability: Not on file  . Transportation needs:    Medical: Not on file    Non-medical: Not on file  Tobacco Use  . Smoking status: Never Smoker  . Smokeless tobacco: Never Used  Substance and Sexual Activity  . Alcohol use: Yes    Comment: occasional  . Drug use: No  . Sexual activity: Not on file  Lifestyle  . Physical activity:    Days per week: Not on file    Minutes per session: Not on file  . Stress: Not on file  Relationships  . Social connections:    Talks on phone: Not on file    Gets together: Not on file    Attends religious service: Not on file    Active member of club or organization: Not on file    Attends meetings of clubs or organizations: Not on file    Relationship status: Not on file   . Intimate partner violence:    Fear of current or ex partner: Not on file    Emotionally abused: Not on file    Physically abused: Not on file    Forced sexual activity: Not on file  Other Topics Concern  . Not on file  Social History Narrative  . Not on file    History reviewed. No pertinent family history.  There were no vitals taken for this visit.  Review of Systems: See HPI above.     Objective:  Physical Exam:  Gen: NAD, comfortable in exam room  Left knee: No gross deformity, ecchymoses.  Mild-mod effusion. No TTP. ROM 0 - 120 degrees with 5/5 strength flexion and extension. Negative ant/post drawers. Negative valgus/varus testing. Negative lachmans, levers. Negative mcmurrays, apleys, patellar apprehension. NV intact distally.  Right knee: No deformity. FROM with 5/5 strength. No tenderness to palpation. NVI distally.   MSK u/s left knee:  Knee effusion noted.  Patellar and quad tendons normal without abnormalities.  Assessment & Plan:  1. Left knee pain - patient's knee exam is reassuring.  Consistent with synovitis and effusion.  Icing, compression, elevation.  Voltaren twice a day with food.  Consider injection or aspiration/injection if not improving.  F/u in 1 month.  Avoid squats, lunges, leg press, plyometrics, twisting maneuvers.

## 2018-04-08 ENCOUNTER — Ambulatory Visit: Payer: BLUE CROSS/BLUE SHIELD | Admitting: Family Medicine

## 2018-08-11 ENCOUNTER — Other Ambulatory Visit: Payer: Self-pay

## 2018-08-11 ENCOUNTER — Emergency Department (HOSPITAL_BASED_OUTPATIENT_CLINIC_OR_DEPARTMENT_OTHER)
Admission: EM | Admit: 2018-08-11 | Discharge: 2018-08-11 | Disposition: A | Discharge status: Home / Self Care | Payer: BLUE CROSS/BLUE SHIELD | Attending: Emergency Medicine | Admitting: Emergency Medicine

## 2018-08-11 ENCOUNTER — Encounter (HOSPITAL_BASED_OUTPATIENT_CLINIC_OR_DEPARTMENT_OTHER): Payer: Self-pay | Admitting: Emergency Medicine

## 2018-08-11 ENCOUNTER — Emergency Department (HOSPITAL_BASED_OUTPATIENT_CLINIC_OR_DEPARTMENT_OTHER): Payer: BLUE CROSS/BLUE SHIELD

## 2018-08-11 DIAGNOSIS — R05 Cough: Secondary | ICD-10-CM

## 2018-08-11 DIAGNOSIS — R059 Cough, unspecified: Secondary | ICD-10-CM

## 2018-08-11 DIAGNOSIS — J40 Bronchitis, not specified as acute or chronic: Secondary | ICD-10-CM | POA: Insufficient documentation

## 2018-08-11 DIAGNOSIS — R079 Chest pain, unspecified: Secondary | ICD-10-CM | POA: Diagnosis not present

## 2018-08-11 DIAGNOSIS — Z79899 Other long term (current) drug therapy: Secondary | ICD-10-CM | POA: Insufficient documentation

## 2018-08-11 LAB — COMPREHENSIVE METABOLIC PANEL
ALT: 36 U/L (ref 0–44)
AST: 34 U/L (ref 15–41)
Albumin: 3.9 g/dL (ref 3.5–5.0)
Alkaline Phosphatase: 57 U/L (ref 38–126)
Anion gap: 8 (ref 5–15)
BUN: 17 mg/dL (ref 6–20)
CO2: 27 mmol/L (ref 22–32)
CREATININE: 1.24 mg/dL (ref 0.61–1.24)
Calcium: 8.9 mg/dL (ref 8.9–10.3)
Chloride: 103 mmol/L (ref 98–111)
GFR calc non Af Amer: >60 mL/min (ref >60)
Glucose, Bld: 105 mg/dL — ABNORMAL HIGH (ref 70–99)
Potassium: 3.6 mmol/L (ref 3.5–5.1)
Sodium: 138 mmol/L (ref 135–145)
Total Bilirubin: 0.8 mg/dL (ref 0.3–1.2)
Total Protein: 7.6 g/dL (ref 6.5–8.1)

## 2018-08-11 LAB — CBC WITH DIFFERENTIAL/PLATELET
ABS IMMATURE GRANULOCYTES: 0.01 10*3/uL (ref 0.00–0.07)
Basophils Absolute: 0 10*3/uL (ref 0.0–0.1)
Basophils Relative: 0 %
Eosinophils Absolute: 0.1 10*3/uL (ref 0.0–0.5)
Eosinophils Relative: 3 %
HEMATOCRIT: 44.1 % (ref 39.0–52.0)
HEMOGLOBIN: 14.4 g/dL (ref 13.0–17.0)
Immature Granulocytes: 0 %
LYMPHS ABS: 2.3 10*3/uL (ref 0.7–4.0)
LYMPHS PCT: 47 %
MCH: 31 pg (ref 26.0–34.0)
MCHC: 32.7 g/dL (ref 30.0–36.0)
MCV: 95 fL (ref 80.0–100.0)
MONO ABS: 0.6 10*3/uL (ref 0.1–1.0)
MONOS PCT: 13 %
NEUTROS ABS: 1.8 10*3/uL (ref 1.7–7.7)
Neutrophils Relative %: 37 %
Platelets: 166 10*3/uL (ref 150–400)
RBC: 4.64 MIL/uL (ref 4.22–5.81)
RDW: 11.9 % (ref 11.5–15.5)
WBC: 4.9 10*3/uL (ref 4.0–10.5)
nRBC: 0 % (ref 0.0–0.2)

## 2018-08-11 LAB — TROPONIN I: Troponin I: <0.03 ng/mL (ref <0.03)

## 2018-08-11 MED ORDER — DEXAMETHASONE SODIUM PHOSPHATE 10 MG/ML IJ SOLN
10.0000 mg | Freq: Once | INTRAMUSCULAR | Status: AC
  Administered 2018-08-11: 10 mg via INTRAVENOUS
  Filled 2018-08-11: qty 1

## 2018-08-11 NOTE — ED Triage Notes (Signed)
Cough for 2 weeks. Chest pain and SOB started today.

## 2018-08-11 NOTE — ED Provider Notes (Signed)
[facility] EMERGENCY DEPARTMENT Provider Note   CSN: [id number] Arrival date & time: 08/11/18  0734     History   Chief Complaint Chief Complaint  Patient presents with  . Chest Pain  . Cough    HPI Jhonnie GarnerJaCal Fischbach is a 37 y.o. male.  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 Had cough and fever subjective. Reports fever has been coming and going, fever has stopped but mucus has continued.   Cough productive of mucus, started out white then became more thick and green then back to white Congestion at times, rhinorrhea.  No sore throat. Did have body aches but has resolved.  Shortness of breath started this AM.  Chest pain started at 6AM today after finishing exercising.  Still having som dyspnea and chest pain. Feels like pressure, like difficult to take deep breath.  No leg pain or swelling, no recent surgeries, no long trips. No hx of DVT/PE.    No fam hx of early CAD No smoking, social etoh rare, no other drugs   Worse when laying down  History reviewed. No pertinent past medical history.  Patient Active Problem List   Diagnosis Date Noted  . Left knee pain 03/08/2018    History reviewed. No pertinent surgical history.      Home Medications    Prior to Admission medications   Medication Sig Start Date End Date Taking? Authorizing Provider  diclofenac (VOLTAREN) 75 MG EC tablet Take 1 tablet (75 mg total) by mouth 2 (two) times daily. 03/08/18   Lenda KelpHudnall, Shane R, MD    Family History No family history on file.  Social History Social History   Tobacco Use  . Smoking status: Never Smoker  . Smokeless tobacco: Never Used  Substance Use Topics  . Alcohol use: Yes    Comment: occasional  . Drug use: No     Allergies   Patient has no known allergies.   Review of Systems Review of Systems  Constitutional: Negative for fever (now resolved).  HENT: Positive for congestion.  Negative for sore throat.   Eyes: Negative for visual disturbance.  Respiratory: Positive for cough and shortness of breath.   Cardiovascular: Positive for chest pain. Negative for leg swelling.  Gastrointestinal: Negative for abdominal pain, nausea and vomiting.  Genitourinary: Negative for difficulty urinating.  Musculoskeletal: Negative for myalgias (resolved).  Skin: Negative for rash.  Neurological: Negative for syncope and headaches (resolved).     Physical Exam Updated Vital Signs BP 140/85 (BP Location: Right Arm)   Pulse (!) 59   Temp 98.2 F (36.8 C) (Oral)   Resp 12   Ht 5\' 11"  (1.803 m)   Wt 118.8 kg   SpO2 99%   BMI 36.54 kg/m   Physical Exam Vitals signs and nursing note reviewed.  Constitutional:      General: He is not in acute distress.    Appearance: He is well-developed. He is not diaphoretic.  HENT:     Head: Normocephalic and atraumatic.  Eyes:     Conjunctiva/sclera: Conjunctivae normal.  Neck:     Musculoskeletal: Normal range of motion.  Cardiovascular:     Rate and Rhythm: Normal rate and regular rhythm.     Heart sounds: Normal heart sounds. No murmur. No friction rub. No gallop.   Pulmonary:     Effort: Pulmonary effort is normal. No respiratory distress.     Breath sounds: Normal breath sounds. No  wheezing or rales.  Abdominal:     General: There is no distension.     Palpations: Abdomen is soft.     Tenderness: There is no abdominal tenderness. There is no guarding.  Skin:    General: Skin is warm and dry.  Neurological:     Mental Status: He is alert and oriented to person, place, and time.      ED Treatments / Results  Labs (all labs ordered are listed, but only abnormal results are displayed) Labs Reviewed  COMPREHENSIVE METABOLIC PANEL - Abnormal; Notable for the following components:      Result Value   Glucose, Bld 105 (*)    All other components within normal limits  CBC WITH DIFFERENTIAL/PLATELET  TROPONIN I    TROPONIN I    EKG EKG Interpretation  Date/Time:  Thursday August 11 2018 07:42:25 EST Ventricular Rate:  71 PR Interval:    QRS Duration: 93 QT Interval:  392 QTC Calculation: 426 R Axis:   32 Text Interpretation:  Sinus arrhythmia No significant change since last tracing Confirmed by Alvira MondaySchlossman, Lysha Schrade ([id number]) on 08/11/2018 7:56:26 AM   Radiology Dg Chest 2 View  Result Date: 08/11/2018 CLINICAL DATA:  Cough and fever. EXAM: CHEST - 2 VIEW COMPARISON:  12/06/2016. FINDINGS: The heart size and mediastinal contours are within normal limits. Both lungs are clear. The visualized skeletal structures are unremarkable. Chronic mild elevation RIGHT hemidiaphragm is stable. IMPRESSION: No active cardiopulmonary disease. Electronically Signed   By: Elsie StainJohn T Curnes M.D.   On: 08/11/2018 08:34    Procedures Procedures (including critical care time)  Medications Ordered in ED Medications - No data to display   Initial Impression / Assessment and Plan / ED Course  I have reviewed the triage vital signs and the nursing notes.  Pertinent labs & imaging results that were available during my care of the patient were reviewed by me and considered in my medical decision making (see chart for details).     37 year old male with no significant medical history presents with concern for cough, chest pain and shortness of breath.  Patient reports improving flulike illness, with continuing cough, however development of chest pain and shortness of breath this morning.  He is PERC negative, and have low suspicion for pulmonary embolus.  Labs showed normal hemoglobin, no leukocytosis, normal electrolytes.  Chest x-ray shows no sign of pneumonia.  EKG was evaluate by me and shows no significant abnormalities, no sign of pericarditis or acute ischemia.  Patient is low risk heart score, and troponin negative x 2. Suspect bronchitis as etiology of symptoms.  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  Will give dose of decadron, recommend continued supportive care and close PCP follow up.   Final Clinical Impressions(s) / ED Diagnoses   Final diagnoses:  Cough  Bronchitis  Chest pain, unspecified type    ED Discharge Orders    None       Alvira MondaySchlossman, Caelan Branden, MD 08/11/18 2158

## 2018-11-15 DIAGNOSIS — T7840XA Allergy, unspecified, initial encounter: Secondary | ICD-10-CM | POA: Diagnosis not present

## 2019-07-14 ENCOUNTER — Other Ambulatory Visit: Payer: Self-pay

## 2019-07-14 ENCOUNTER — Emergency Department (HOSPITAL_BASED_OUTPATIENT_CLINIC_OR_DEPARTMENT_OTHER)
Admission: EM | Admit: 2019-07-14 | Discharge: 2019-07-14 | Disposition: A | Discharge status: Home / Self Care | Payer: BC Managed Care – PPO | Attending: Emergency Medicine | Admitting: Emergency Medicine

## 2019-07-14 ENCOUNTER — Encounter (HOSPITAL_BASED_OUTPATIENT_CLINIC_OR_DEPARTMENT_OTHER): Payer: Self-pay | Admitting: *Deleted

## 2019-07-14 DIAGNOSIS — M545 Low back pain, unspecified: Secondary | ICD-10-CM

## 2019-07-14 DIAGNOSIS — Z79899 Other long term (current) drug therapy: Secondary | ICD-10-CM | POA: Diagnosis not present

## 2019-07-14 LAB — URINALYSIS, ROUTINE W REFLEX MICROSCOPIC
Bilirubin Urine: NEGATIVE
Glucose, UA: NEGATIVE mg/dL
Ketones, ur: NEGATIVE mg/dL
Leukocytes,Ua: NEGATIVE
Nitrite: NEGATIVE
Protein, ur: NEGATIVE mg/dL
Specific Gravity, Urine: 1.025 (ref 1.005–1.030)
pH: 6 (ref 5.0–8.0)

## 2019-07-14 LAB — URINALYSIS, MICROSCOPIC (REFLEX)

## 2019-07-14 MED ORDER — METHOCARBAMOL 500 MG PO TABS
ORAL_TABLET | ORAL | Status: AC
  Filled 2019-07-14: qty 1

## 2019-07-14 MED ORDER — METHOCARBAMOL 500 MG PO TABS
500.0000 mg | ORAL_TABLET | Freq: Once | ORAL | Status: AC
  Administered 2019-07-14: 500 mg via ORAL

## 2019-07-14 MED ORDER — NAPROXEN 250 MG PO TABS
ORAL_TABLET | ORAL | Status: AC
  Filled 2019-07-14: qty 2

## 2019-07-14 MED ORDER — NAPROXEN 250 MG PO TABS
500.0000 mg | ORAL_TABLET | Freq: Once | ORAL | Status: AC
  Administered 2019-07-14: 500 mg via ORAL

## 2019-07-14 MED ORDER — NAPROXEN 375 MG PO TABS: 375.0000 mg | ORAL_TABLET | Freq: Two times a day (BID) | ORAL | 0 refills | Status: AC

## 2019-07-14 MED ORDER — METHOCARBAMOL 750 MG PO TABS: 750.0000 mg | ORAL_TABLET | Freq: Every evening | ORAL | 0 refills | Status: AC | PRN

## 2019-07-14 NOTE — Discharge Instructions (Addendum)

## 2019-07-14 NOTE — ED Triage Notes (Signed)
Pt c/o lower back pain and hip pain x 1 day denies injury

## 2019-07-14 NOTE — ED Provider Notes (Signed)
[facility] EMERGENCY DEPARTMENT Provider Note   CSN: [id number] Arrival date & time: 07/14/19  1842     History   Chief Complaint Chief Complaint  Patient presents with  . Back Pain    HPI Anthony Pham is a 38 y.o. male.     HPI   Pt is a 38 y/o male who presents to the ED today c/o low back pain. States that he woke up this morning with lower back pain. Pain was originally on the right but now it is also on the left. Pain is currently in the left buttock. Pain intermittently radiating down the right leg. He took a muscle relaxer without resolution of symptoms. Denies dysuria, frequency, or urgency but states his back feels better when he uses the restroom and he wonders if he has a bladder infection.   Pt denies any persistent numbness/tingling to the BLE. Denies saddle anesthesia. Denies loss of control of bowels or bladder. No urinary retention. No fevers. Denies a h/o IVDU. Denies a h/o CA or recent unintended weight loss.  History reviewed. No pertinent past medical history.  Patient Active Problem List   Diagnosis Date Noted  . Left knee pain 03/08/2018    History reviewed. No pertinent surgical history.      Home Medications    Prior to Admission medications   Medication Sig Start Date End Date Taking? Authorizing Provider  diclofenac (VOLTAREN) 75 MG EC tablet Take 1 tablet (75 mg total) by mouth 2 (two) times daily. 03/08/18   Hudnall, Azucena Fallen, MD  methocarbamol (ROBAXIN) 750 MG tablet Take 1 tablet (750 mg total) by mouth at bedtime as needed for up to 5 days for muscle spasms. 07/14/19 07/19/19  Leomar Westberg S, PA-C  naproxen (NAPROSYN) 375 MG tablet Take 1 tablet (375 mg total) by mouth 2 (two) times daily for 5 days. 07/14/19 07/19/19  Kenric Ginger S, PA-C    Family History No family history on file.  Social History Social History   Tobacco Use  . Smoking status: Never Smoker  . Smokeless tobacco: Never Used  Substance Use Topics   . Alcohol use: Yes    Comment: occasional  . Drug use: No     Allergies   Patient has no known allergies.   Review of Systems Review of Systems  Constitutional: Negative for fever.  HENT: Negative for ear pain and sore throat.   Respiratory: Negative for shortness of breath.   Cardiovascular: Negative for chest pain.  Gastrointestinal: Negative for abdominal pain.  Genitourinary: Negative for flank pain.  Musculoskeletal: Positive for back pain.  Neurological: Negative for numbness and headaches.  All other systems reviewed and are negative.    Physical Exam Updated Vital Signs BP 126/90   Pulse 76   Temp 98.5 F (36.9 C) (Oral)   Resp 16   Ht 5\' 11"  (1.803 m)   Wt 104.3 kg   SpO2 99%   BMI 32.08 kg/m   Physical Exam Vitals signs and nursing note reviewed.  Constitutional:      Appearance: He is well-developed.  HENT:     Head: Normocephalic and atraumatic.  Eyes:     Conjunctiva/sclera: Conjunctivae normal.  Neck:     Musculoskeletal: Neck supple.  Cardiovascular:     Rate and Rhythm: Normal rate and regular rhythm.     Heart sounds: No murmur.  Pulmonary:     Effort: Pulmonary effort is normal. No respiratory distress.     Breath sounds: Normal  breath sounds.  Abdominal:     General: Bowel sounds are normal.     Palpations: Abdomen is soft.     Tenderness: There is no abdominal tenderness. There is no right CVA tenderness, left CVA tenderness, guarding or rebound.  Musculoskeletal:     Comments: TTP to the bilateral lumbar paraspinous muscles.  No focal tenderness to the midline lumbar spine.  5/5 strength to the bilateral lower extremities.  Normal sensation throughout.  Skin:    General: Skin is warm and dry.  Neurological:     Mental Status: He is alert.      ED Treatments / Results  Labs (all labs ordered are listed, but only abnormal results are displayed) Labs Reviewed  URINALYSIS, ROUTINE W REFLEX MICROSCOPIC - Abnormal; Notable for the  following components:      Result Value   Hgb urine dipstick TRACE (*)    All other components within normal limits  URINALYSIS, MICROSCOPIC (REFLEX) - Abnormal; Notable for the following components:   Bacteria, UA FEW (*)    All other components within normal limits    EKG None  Radiology No results found.  Procedures Procedures (including critical care time)  Medications Ordered in ED Medications - No data to display   Initial Impression / Assessment and Plan / ED Course  I have reviewed the triage vital signs and the nursing notes.  Pertinent labs & imaging results that were available during my care of the patient were reviewed by me and considered in my medical decision making (see chart for details).     Final Clinical Impressions(s) / ED Diagnoses   Final diagnoses:  Acute low back pain, unspecified back pain laterality, unspecified whether sciatica present   Patient with back pain.  No neurological deficits and normal neuro exam.  Patient can walk but states is painful.  No loss of bowel or bladder control.  No concern for cauda equina.  No fever, night sweats, weight loss, h/o cancer, IVDU.  Patient concerned that he may have bladder infection because he states that his back pain improves after he urinates and he requested getting urinalysis.  Urinalysis showed hematuria but no evidence of infection.  I have low suspicion for infection in this patient.  I have low suspicion for nephrolithiasis that he does not have any CVA tenderness or urinary symptoms to support this diagnosis.  It seems to me that he probably has a muscle spasm versus strain to the lower back.  RICE protocol and pain medicine indicated and discussed with patient.  We will also give muscle relaxer.  Advised on follow-up and return precautions.  He voiced understanding of the plan and reasons to return.  All questions answered.  Patient stable for discharge.   ED Discharge Orders         Ordered     naproxen (NAPROSYN) 375 MG tablet  2 times daily     07/14/19 2118    methocarbamol (ROBAXIN) 750 MG tablet  At bedtime PRN     07/14/19 2118           Bishop Dublin 07/14/19 2119    Blanchie Dessert, MD 07/14/19 [phone]

## 2019-09-20 DIAGNOSIS — R110 Nausea: Secondary | ICD-10-CM | POA: Diagnosis not present

## 2019-09-20 DIAGNOSIS — R0989 Other specified symptoms and signs involving the circulatory and respiratory systems: Secondary | ICD-10-CM | POA: Diagnosis not present

## 2019-09-20 DIAGNOSIS — J029 Acute pharyngitis, unspecified: Secondary | ICD-10-CM | POA: Diagnosis not present

## 2019-09-20 DIAGNOSIS — Z03818 Encounter for observation for suspected exposure to other biological agents ruled out: Secondary | ICD-10-CM | POA: Diagnosis not present

## 2019-10-17 DIAGNOSIS — S0502XA Injury of conjunctiva and corneal abrasion without foreign body, left eye, initial encounter: Secondary | ICD-10-CM | POA: Diagnosis not present

## 2019-11-02 ENCOUNTER — Emergency Department (HOSPITAL_BASED_OUTPATIENT_CLINIC_OR_DEPARTMENT_OTHER): Payer: BC Managed Care – PPO

## 2019-11-02 ENCOUNTER — Emergency Department (HOSPITAL_BASED_OUTPATIENT_CLINIC_OR_DEPARTMENT_OTHER)
Admission: EM | Admit: 2019-11-02 | Discharge: 2019-11-02 | Disposition: A | Discharge status: Home / Self Care | Payer: BC Managed Care – PPO | Attending: Emergency Medicine | Admitting: Emergency Medicine

## 2019-11-02 ENCOUNTER — Encounter (HOSPITAL_BASED_OUTPATIENT_CLINIC_OR_DEPARTMENT_OTHER): Payer: Self-pay | Admitting: Emergency Medicine

## 2019-11-02 ENCOUNTER — Other Ambulatory Visit: Payer: Self-pay

## 2019-11-02 DIAGNOSIS — R109 Unspecified abdominal pain: Secondary | ICD-10-CM

## 2019-11-02 DIAGNOSIS — R1032 Left lower quadrant pain: Secondary | ICD-10-CM | POA: Diagnosis not present

## 2019-11-02 LAB — URINALYSIS, MICROSCOPIC (REFLEX)
Bacteria, UA: NONE SEEN
Squamous Epithelial / HPF: NONE SEEN (ref 0–5)
WBC, UA: NONE SEEN WBC/hpf (ref 0–5)

## 2019-11-02 LAB — URINALYSIS, ROUTINE W REFLEX MICROSCOPIC
Bilirubin Urine: NEGATIVE
Glucose, UA: NEGATIVE mg/dL
Ketones, ur: NEGATIVE mg/dL
Leukocytes,Ua: NEGATIVE
Nitrite: NEGATIVE
Protein, ur: NEGATIVE mg/dL
Specific Gravity, Urine: 1.025 (ref 1.005–1.030)
pH: 6.5 (ref 5.0–8.0)

## 2019-11-02 LAB — BASIC METABOLIC PANEL
Anion gap: 8 (ref 5–15)
BUN: 12 mg/dL (ref 6–20)
CO2: 24 mmol/L (ref 22–32)
Calcium: 9.3 mg/dL (ref 8.9–10.3)
Chloride: 106 mmol/L (ref 98–111)
Creatinine, Ser: 1.06 mg/dL (ref 0.61–1.24)
GFR calc Af Amer: >60 mL/min (ref >60)
GFR calc non Af Amer: >60 mL/min (ref >60)
Glucose, Bld: 107 mg/dL — ABNORMAL HIGH (ref 70–99)
Potassium: 3.8 mmol/L (ref 3.5–5.1)
Sodium: 138 mmol/L (ref 135–145)

## 2019-11-02 LAB — CBC WITH DIFFERENTIAL/PLATELET
Abs Immature Granulocytes: 0.05 10*3/uL (ref 0.00–0.07)
Basophils Absolute: 0 10*3/uL (ref 0.0–0.1)
Basophils Relative: 0 %
Eosinophils Absolute: 0.1 10*3/uL (ref 0.0–0.5)
Eosinophils Relative: 2 %
HCT: 44.5 % (ref 39.0–52.0)
Hemoglobin: 15.4 g/dL (ref 13.0–17.0)
Immature Granulocytes: 1 %
Lymphocytes Relative: 17 %
Lymphs Abs: 1.2 10*3/uL (ref 0.7–4.0)
MCH: 32.8 pg (ref 26.0–34.0)
MCHC: 34.6 g/dL (ref 30.0–36.0)
MCV: 94.9 fL (ref 80.0–100.0)
Monocytes Absolute: 0.6 10*3/uL (ref 0.1–1.0)
Monocytes Relative: 8 %
Neutro Abs: 5.3 10*3/uL (ref 1.7–7.7)
Neutrophils Relative %: 72 %
Platelets: 240 10*3/uL (ref 150–400)
RBC: 4.69 MIL/uL (ref 4.22–5.81)
RDW: 11.9 % (ref 11.5–15.5)
WBC: 7.4 10*3/uL (ref 4.0–10.5)
nRBC: 0 % (ref 0.0–0.2)

## 2019-11-02 MED ORDER — METHOCARBAMOL 500 MG PO TABS: 500.0000 mg | ORAL_TABLET | Freq: Three times a day (TID) | ORAL | 0 refills | Status: AC | PRN

## 2019-11-02 MED ORDER — IBUPROFEN 800 MG PO TABS: 800.0000 mg | ORAL_TABLET | Freq: Three times a day (TID) | ORAL | 0 refills | Status: AC | PRN

## 2019-11-02 MED ORDER — IBUPROFEN 800 MG PO TABS: 800.0000 mg | ORAL_TABLET | Freq: Three times a day (TID) | ORAL | 0 refills | Status: DC | PRN

## 2019-11-02 MED ORDER — KETOROLAC TROMETHAMINE 30 MG/ML IJ SOLN
30.0000 mg | Freq: Once | INTRAMUSCULAR | Status: AC
  Administered 2019-11-02: 30 mg via INTRAMUSCULAR
  Filled 2019-11-02: qty 1

## 2019-11-02 NOTE — Discharge Instructions (Signed)
You were seen in the emergency department for some left low back and abdominal pain.  You had blood work urinalysis and a CAT scan that did not show any serious findings.  This may be muscular and we are treating you with some ibuprofen and muscle relaxant.  Please follow-up with your doctor or return to the emergency department if any worsening symptoms.

## 2019-11-02 NOTE — ED Triage Notes (Signed)
Pt c/o pain below ribs on left side that started yesterday while working. Pt states he does a lot of heavy lifting.

## 2019-11-02 NOTE — ED Provider Notes (Signed)
[facility] EMERGENCY DEPARTMENT Provider Note   CSN: [id number] Arrival date & time: 11/02/19  0102     History Chief Complaint  Patient presents with  . Abdominal Pain    Anthony Pham is a 39 y.o. male.  He is complaining of a work-related injury.  He said he started having pain in his left low back that wraps around into his left abdomen starting yesterday while working.  Worse with movement twisting taking a deep breath.  Feels like spasm.  Throbbing.  Moderate severity.  Somewhat improved with heating pad and muscle relaxant.  No numbness or weakness.  No cough nausea vomiting or urinary symptoms.  No fevers or chills.  The history is provided by the patient.  Flank Pain This is a new problem. The current episode started yesterday. The problem occurs constantly. The problem has not changed since onset.Associated symptoms include abdominal pain. Pertinent negatives include no chest pain, no headaches and no shortness of breath. The symptoms are aggravated by bending and twisting. Nothing relieves the symptoms. He has tried a warm compress for the symptoms. The treatment provided mild relief.       History reviewed. No pertinent past medical history.  Patient Active Problem List   Diagnosis Date Noted  . Left knee pain 03/08/2018    History reviewed. No pertinent surgical history.     No family history on file.  Social History   Tobacco Use  . Smoking status: Never Smoker  . Smokeless tobacco: Never Used  Substance Use Topics  . Alcohol use: Yes    Comment: occasional  . Drug use: No    Home Medications Prior to Admission medications   Not on File    Allergies    Patient has no known allergies.  Review of Systems   Review of Systems  Constitutional: Negative for fever.  HENT: Negative for sore throat.   Eyes: Negative for visual disturbance.  Respiratory: Negative for shortness of breath.   Cardiovascular: Negative for chest pain.    Gastrointestinal: Positive for abdominal pain. Negative for nausea and vomiting.  Genitourinary: Positive for flank pain. Negative for dysuria.  Musculoskeletal: Positive for back pain.  Skin: Negative for rash.  Neurological: Negative for headaches.    Physical Exam Updated Vital Signs BP 126/83 (BP Location: Left Arm)   Pulse 74   Temp 98.3 F (36.8 C) (Oral)   Resp 18   Ht 5\' 11"  (1.803 m)   Wt 102.1 kg   SpO2 97%   BMI 31.38 kg/m   Physical Exam Vitals and nursing note reviewed.  Constitutional:      Appearance: He is well-developed.  HENT:     Head: Normocephalic and atraumatic.  Eyes:     Conjunctiva/sclera: Conjunctivae normal.  Cardiovascular:     Rate and Rhythm: Normal rate and regular rhythm.     Heart sounds: No murmur.  Pulmonary:     Effort: Pulmonary effort is normal. No respiratory distress.     Breath sounds: Normal breath sounds.  Abdominal:     Palpations: Abdomen is soft.     Tenderness: There is abdominal tenderness. There is no right CVA tenderness, left CVA tenderness, guarding or rebound.     Comments: He is tender through left flank left lateral back left lateral abdomen.  Musculoskeletal:     Cervical back: Neck supple.  Skin:    General: Skin is warm and dry.     Capillary Refill: Capillary refill takes less than 2  seconds.  Neurological:     General: No focal deficit present.     Mental Status: He is alert.     GCS: GCS eye subscore is 4. GCS verbal subscore is 5. GCS motor subscore is 6.     Sensory: No sensory deficit.     Motor: No weakness.     Gait: Gait normal.     ED Results / Procedures / Treatments   Labs (all labs ordered are listed, but only abnormal results are displayed) Labs Reviewed  URINALYSIS, ROUTINE W REFLEX MICROSCOPIC - Abnormal; Notable for the following components:      Result Value   Hgb urine dipstick TRACE (*)    All other components within normal limits  BASIC METABOLIC PANEL - Abnormal; Notable for  the following components:   Glucose, Bld 107 (*)    All other components within normal limits  URINALYSIS, MICROSCOPIC (REFLEX)  CBC WITH DIFFERENTIAL/PLATELET    EKG None  Radiology CT Renal Stone Study  Result Date: 11/02/2019 CLINICAL DATA:  Left flank pain since yesterday. EXAM: CT ABDOMEN AND PELVIS WITHOUT CONTRAST TECHNIQUE: Multidetector CT imaging of the abdomen and pelvis was performed following the standard protocol without IV contrast. COMPARISON:  None. FINDINGS: Lower chest: Insert lung bases Hepatobiliary: No focal hepatic lesions or intrahepatic biliary dilatation. The gallbladder is normal. No common bile duct dilatation. Pancreas: No mass, inflammation or ductal dilatation. Spleen: Normal size. No focal lesions. Adrenals/Urinary Tract: Adrenal glands and kidneys are unremarkable. No renal, ureteral or bladder calculi or mass is identified without contrast. No perinephric inflammatory changes. Stomach/Bowel: The stomach, duodenum, small bowel and colon are grossly normal without oral contrast. No inflammatory changes, mass lesions or obstructive findings. The appendix is normal. Scattered descending and sigmoid colon diverticulosis but no findings for acute diverticulitis. Vascular/Lymphatic: The aorta is normal in caliber. No atheroscerlotic calcifications. No mesenteric of retroperitoneal mass or adenopathy. Small scattered lymph nodes are noted. Reproductive: The prostate gland and seminal vesicles are unremarkable. Other: No pelvic mass or adenopathy. No free pelvic fluid collections. No inguinal mass or adenopathy. No abdominal wall hernia or subcutaneous lesions. Musculoskeletal: No significant bony findings. IMPRESSION: 1. No renal, ureteral or bladder calculi or mass. 2. No acute abdominal/pelvic findings, mass lesions or adenopathy. 3. Descending and sigmoid colon diverticulosis without findings for acute diverticulitis. Electronically Signed   By: Marijo Sanes M.D.   On:  11/02/2019 07:54    Procedures Procedures (including critical care time)  Medications Ordered in ED Medications  ketorolac (TORADOL) 30 MG/ML injection 30 mg (30 mg Intramuscular Given 11/02/19 7619)    ED Course  I have reviewed the triage vital signs and the nursing notes.  Pertinent labs & imaging results that were available during my care of the patient were reviewed by me and considered in my medical decision making (see chart for details).  Clinical Course as of Nov 01 1708  Thu Nov 02, 2019  0701 Differential includes muscle spasm, muscular strain, less likely renal colic.  Checking a UA.  Toradol IM.   [MB]  [id number] Patient's UA showed 6-10 reds.  We will put him in for some labs and a CT KUB.   [MB]  5093 CT interpreted by me as no acute findings.  Awaiting radiology reading.   [MB]    Clinical Course User Index [MB] Hayden Rasmussen, MD   MDM Rules/Calculators/A&P  Final Clinical Impression(s) / ED Diagnoses Final diagnoses:  Acute left flank pain    Rx / DC Orders ED Discharge Orders         Ordered    ibuprofen (ADVIL) 800 MG tablet  Every 8 hours PRN,   Status:  Discontinued     11/02/19 0809    methocarbamol (ROBAXIN) 500 MG tablet  Every 8 hours PRN     11/02/19 0809    ibuprofen (ADVIL) 800 MG tablet  Every 8 hours PRN     11/02/19 0810           Terrilee Files, MD 11/02/19 1711

## 2019-11-27 ENCOUNTER — Other Ambulatory Visit: Payer: Self-pay

## 2019-11-27 ENCOUNTER — Emergency Department (HOSPITAL_BASED_OUTPATIENT_CLINIC_OR_DEPARTMENT_OTHER)
Admission: EM | Admit: 2019-11-27 | Discharge: 2019-11-27 | Disposition: A | Discharge status: Home / Self Care | Payer: Worker's Compensation

## 2019-12-24 ENCOUNTER — Other Ambulatory Visit: Payer: Self-pay

## 2019-12-24 ENCOUNTER — Encounter (HOSPITAL_BASED_OUTPATIENT_CLINIC_OR_DEPARTMENT_OTHER): Payer: Self-pay | Admitting: *Deleted

## 2019-12-24 ENCOUNTER — Emergency Department (HOSPITAL_BASED_OUTPATIENT_CLINIC_OR_DEPARTMENT_OTHER): Payer: BC Managed Care – PPO

## 2019-12-24 ENCOUNTER — Emergency Department (HOSPITAL_BASED_OUTPATIENT_CLINIC_OR_DEPARTMENT_OTHER)
Admission: EM | Admit: 2019-12-24 | Discharge: 2019-12-24 | Disposition: A | Discharge status: Home / Self Care | Payer: BC Managed Care – PPO | Attending: Emergency Medicine | Admitting: Emergency Medicine

## 2019-12-24 DIAGNOSIS — R509 Fever, unspecified: Secondary | ICD-10-CM | POA: Diagnosis not present

## 2019-12-24 DIAGNOSIS — U071 COVID-19: Secondary | ICD-10-CM | POA: Diagnosis not present

## 2019-12-24 DIAGNOSIS — Z79899 Other long term (current) drug therapy: Secondary | ICD-10-CM | POA: Insufficient documentation

## 2019-12-24 DIAGNOSIS — R05 Cough: Secondary | ICD-10-CM | POA: Diagnosis not present

## 2019-12-24 HISTORY — DX: Bronchitis, not specified as acute or chronic: J40

## 2019-12-24 MED ORDER — ACETAMINOPHEN 325 MG PO TABS
650.0000 mg | ORAL_TABLET | Freq: Once | ORAL | Status: AC | PRN
  Administered 2019-12-24: 650 mg via ORAL
  Filled 2019-12-24: qty 2

## 2019-12-24 NOTE — ED Notes (Signed)
ED Provider at bedside. 

## 2019-12-24 NOTE — ED Triage Notes (Signed)
Pt reports fever x 1 day with dry cough. He is on antibiotics

## 2019-12-24 NOTE — ED Provider Notes (Addendum)
[facility] EMERGENCY DEPARTMENT Provider Note   CSN: [id number] Arrival date & time: 12/24/19  1936     History Chief Complaint  Patient presents with  . Fever    Anthony Pham is a 39 y.o. male.  Patient is a 39 year old male with no significant past medical history.  He presents today for evaluation of body aches and fever.  This is been ongoing for the past 2 days.  He describes cough, but no chest pain or difficulty breathing.  He denies exposure to known Covid patients.  The history is provided by the patient.  Fever Max temp prior to arrival:  101.6 Onset quality:  Sudden Duration:  1 day Timing:  Constant Progression:  Unchanged Chronicity:  New Relieved by:  Nothing Worsened by:  Nothing Ineffective treatments:  None tried      Past Medical History:  Diagnosis Date  . Bronchitis     Patient Active Problem List   Diagnosis Date Noted  . Left knee pain 03/08/2018    History reviewed. No pertinent surgical history.     No family history on file.  Social History   Tobacco Use  . Smoking status: Never Smoker  . Smokeless tobacco: Never Used  Substance Use Topics  . Alcohol use: Yes    Comment: occasional  . Drug use: No    Home Medications Prior to Admission medications   Medication Sig Start Date End Date Taking? Authorizing Provider  ibuprofen (ADVIL) 800 MG tablet Take 1 tablet (800 mg total) by mouth every 8 (eight) hours as needed. 11/02/19   Terrilee Files, MD  methocarbamol (ROBAXIN) 500 MG tablet Take 1 tablet (500 mg total) by mouth every 8 (eight) hours as needed for muscle spasms. 11/02/19   Terrilee Files, MD    Allergies    Patient has no known allergies.  Review of Systems   Review of Systems  Constitutional: Positive for fever.  All other systems reviewed and are negative.   Physical Exam Updated Vital Signs BP (!) 144/101 (BP Location: Right Arm)   Pulse (!) 104   Temp (!) 101.4 F (38.6 C) (Oral)   Resp  20   Ht 5\' 10"  (1.778 m)   Wt 102.1 kg   SpO2 96%   BMI 32.28 kg/m   Physical Exam Vitals and nursing note reviewed.  Constitutional:      General: He is not in acute distress.    Appearance: He is well-developed. He is not diaphoretic.  HENT:     Head: Normocephalic and atraumatic.     Mouth/Throat:     Mouth: Mucous membranes are moist.     Pharynx: No oropharyngeal exudate or posterior oropharyngeal erythema.  Cardiovascular:     Rate and Rhythm: Normal rate and regular rhythm.     Heart sounds: No murmur. No friction rub.  Pulmonary:     Effort: Pulmonary effort is normal. No respiratory distress.     Breath sounds: Normal breath sounds. No wheezing or rales.  Abdominal:     General: Bowel sounds are normal. There is no distension.     Palpations: Abdomen is soft.     Tenderness: There is no abdominal tenderness.  Musculoskeletal:        General: Normal range of motion.     Cervical back: Normal range of motion and neck supple.  Skin:    General: Skin is warm and dry.  Neurological:     Mental Status: He is alert and  oriented to person, place, and time.     Coordination: Coordination normal.     ED Results / Procedures / Treatments   Labs (all labs ordered are listed, but only abnormal results are displayed) Labs Reviewed  SARS CORONAVIRUS 2 (TAT 6-24 HRS)    EKG None  Radiology No results found.  Procedures Procedures (including critical care time)  Medications Ordered in ED Medications  acetaminophen (TYLENOL) tablet 650 mg (650 mg Oral Given 12/24/19 2006)    ED Course  I have reviewed the triage vital signs and the nursing notes.  Pertinent labs & imaging results that were available during my care of the patient were reviewed by me and considered in my medical decision making (see chart for details).    MDM Rules/Calculators/A&P  Patient presents here with complaints of fever, cough, and not feeling well.  His vitals are stable and oxygen  saturations are normal.  Chest x-ray is clear.  Patient appears clinically well and I suspect there is a strong likelihood of this illness being COVID-19.  A Covid swab was obtained and is pending.  Patient to isolate at home until these results are known.  [name] was evaluated in Emergency Department on 12/24/2019 for the symptoms described in the history of present illness. He was evaluated in the context of the global COVID-19 pandemic, which necessitated consideration that the patient might be at risk for infection with the SARS-CoV-2 virus that causes COVID-19. Institutional protocols and algorithms that pertain to the evaluation of patients at risk for COVID-19 are in a state of rapid change based on information released by regulatory bodies including the CDC and federal and state organizations. These policies and algorithms were followed during the patient's care in the ED.   Final Clinical Impression(s) / ED Diagnoses Final diagnoses:  None    Rx / DC Orders ED Discharge Orders    None       Veryl Speak, MD 12/24/19 2204    Veryl Speak, MD 12/24/19 2205

## 2019-12-24 NOTE — Discharge Instructions (Addendum)
Take Tylenol 1000 mg rotated with ibuprofen 600 mg every 4 hours as needed for fever.  Drink plenty of fluids and get plenty of rest.  Isolate at home until the results of your Covid test are known.  This should be some time tomorrow.  Return to the ER if you develop difficulty breathing, severe chest pain, or other new and concerning symptoms.        Infection Prevention Recommendations for Individuals Confirmed to have, or Being Evaluated for, 2019 Novel Coronavirus (COVID-19) Infection Who Receive Care at Home  Individuals who are confirmed to have, or are being evaluated for, COVID-19 should follow the prevention steps below until a healthcare provider or local or state health department says they can return to normal activities.  Stay home except to get medical care You should restrict activities outside your home, except for getting medical care. Do not go to work, school, or public areas, and do not use public transportation or taxis.  Call ahead before visiting your doctor Before your medical appointment, call the healthcare provider and tell them that you have, or are being evaluated for, COVID-19 infection. This will help the healthcare provider's office take steps to keep other people from getting infected. Ask your healthcare provider to call the local or state health department.  Monitor your symptoms Seek prompt medical attention if your illness is worsening (e.g., difficulty breathing). Before going to your medical appointment, call the healthcare provider and tell them that you have, or are being evaluated for, COVID-19 infection. Ask your healthcare provider to call the local or state health department.  Wear a facemask You should wear a facemask that covers your nose and mouth when you are in the same room with other people and when you visit a healthcare provider. People who live with or visit you should also wear a facemask while they are in the same room  with you.  Separate yourself from other people in your home As much as possible, you should stay in a different room from other people in your home. Also, you should use a separate bathroom, if available.  Avoid sharing household items You should not share dishes, drinking glasses, cups, eating utensils, towels, bedding, or other items with other people in your home. After using these items, you should wash them thoroughly with soap and water.  Cover your coughs and sneezes Cover your mouth and nose with a tissue when you cough or sneeze, or you can cough or sneeze into your sleeve. Throw used tissues in a lined trash can, and immediately wash your hands with soap and water for at least 20 seconds or use an alcohol-based hand rub.  Wash your [employer] your hands often and thoroughly with soap and water for at least 20 seconds. You can use an alcohol-based hand sanitizer if soap and water are not available and if your hands are not visibly dirty. Avoid touching your eyes, nose, and mouth with unwashed hands.   Prevention Steps for Caregivers and Household Members of Individuals Confirmed to have, or Being Evaluated for, COVID-19 Infection Being Cared for in the Home  If you live with, or provide care at home for, a person confirmed to have, or being evaluated for, COVID-19 infection please follow these guidelines to prevent infection:  Follow healthcare provider's instructions Make sure that you understand and can help the patient follow any healthcare provider instructions for all care.  Provide for the patient's basic needs You should help the patient with  basic needs in the home and provide support for getting groceries, prescriptions, and other personal needs.  Monitor the patient's symptoms If they are getting sicker, call his or her medical provider and tell them that the patient has, or is being evaluated for, COVID-19 infection. This will help the healthcare provider's  office take steps to keep other people from getting infected. Ask the healthcare provider to call the local or state health department.  Limit the number of people who have contact with the patient If possible, have only one caregiver for the patient. Other household members should stay in another home or place of residence. If this is not possible, they should stay in another room, or be separated from the patient as much as possible. Use a separate bathroom, if available. Restrict visitors who do not have an essential need to be in the home.  Keep older adults, very young children, and other sick people away from the patient Keep older adults, very young children, and those who have compromised immune systems or chronic health conditions away from the patient. This includes people with chronic heart, lung, or kidney conditions, diabetes, and cancer.  Ensure good ventilation Make sure that shared spaces in the home have good air flow, such as from an air conditioner or an opened window, weather permitting.  Wash your hands often Wash your hands often and thoroughly with soap and water for at least 20 seconds. You can use an alcohol based hand sanitizer if soap and water are not available and if your hands are not visibly dirty. Avoid touching your eyes, nose, and mouth with unwashed hands. Use disposable paper towels to dry your hands. If not available, use dedicated cloth towels and replace them when they become wet.  Wear a facemask and gloves Wear a disposable facemask at all times in the room and gloves when you touch or have contact with the patient's blood, body fluids, and/or secretions or excretions, such as sweat, saliva, sputum, nasal mucus, vomit, urine, or feces.  Ensure the mask fits over your nose and mouth tightly, and do not touch it during use. Throw out disposable facemasks and gloves after using them. Do not reuse. Wash your hands immediately after removing your facemask  and gloves. If your personal clothing becomes contaminated, carefully remove clothing and launder. Wash your hands after handling contaminated clothing. Place all used disposable facemasks, gloves, and other waste in a lined container before disposing them with other household waste. Remove gloves and wash your hands immediately after handling these items.  Do not share dishes, glasses, or other household items with the patient Avoid sharing household items. You should not share dishes, drinking glasses, cups, eating utensils, towels, bedding, or other items with a patient who is confirmed to have, or being evaluated for, COVID-19 infection. After the person uses these items, you should wash them thoroughly with soap and water.  Wash laundry thoroughly Immediately remove and wash clothes or bedding that have blood, body fluids, and/or secretions or excretions, such as sweat, saliva, sputum, nasal mucus, vomit, urine, or feces, on them. Wear gloves when handling laundry from the patient. Read and follow directions on labels of laundry or clothing items and detergent. In general, wash and dry with the warmest temperatures recommended on the label.  Clean all areas the individual has used often Clean all touchable surfaces, such as counters, tabletops, doorknobs, bathroom fixtures, toilets, phones, keyboards, tablets, and bedside tables, every day. Also, clean any surfaces that may have  blood, body fluids, and/or secretions or excretions on them. Wear gloves when cleaning surfaces the patient has come in contact with. Use a diluted bleach solution (e.g., dilute bleach with 1 part bleach and 10 parts water) or a household disinfectant with a label that says EPA-registered for coronaviruses. To make a bleach solution at home, add 1 tablespoon of bleach to 1 quart (4 cups) of water. For a larger supply, add  cup of bleach to 1 gallon (16 cups) of water. Read labels of cleaning products and follow  recommendations provided on product labels. Labels contain instructions for safe and effective use of the cleaning product including precautions you should take when applying the product, such as wearing gloves or eye protection and making sure you have good ventilation during use of the product. Remove gloves and wash hands immediately after cleaning.  Monitor yourself for signs and symptoms of illness Caregivers and household members are considered close contacts, should monitor their health, and will be asked to limit movement outside of the home to the extent possible. Follow the monitoring steps for close contacts listed on the symptom monitoring form.   ? If you have additional questions, contact your local health department or call the epidemiologist on call at [phone] (available 24/7). ? This guidance is subject to change. For the most up-to-date guidance from [facility], please refer to their website: [email or web address]

## 2019-12-25 LAB — SARS CORONAVIRUS 2 (TAT 6-24 HRS): SARS Coronavirus 2: POSITIVE — AB

## 2020-03-25 ENCOUNTER — Emergency Department (HOSPITAL_BASED_OUTPATIENT_CLINIC_OR_DEPARTMENT_OTHER)
Admission: EM | Admit: 2020-03-25 | Discharge: 2020-03-25 | Disposition: A | Discharge status: Home / Self Care | Payer: BC Managed Care – PPO | Attending: Emergency Medicine | Admitting: Emergency Medicine

## 2020-03-25 ENCOUNTER — Encounter (HOSPITAL_BASED_OUTPATIENT_CLINIC_OR_DEPARTMENT_OTHER): Payer: Self-pay | Admitting: *Deleted

## 2020-03-25 ENCOUNTER — Other Ambulatory Visit: Payer: Self-pay

## 2020-03-25 DIAGNOSIS — M542 Cervicalgia: Secondary | ICD-10-CM | POA: Insufficient documentation

## 2020-03-25 DIAGNOSIS — M25512 Pain in left shoulder: Secondary | ICD-10-CM | POA: Insufficient documentation

## 2020-03-25 DIAGNOSIS — Z5321 Procedure and treatment not carried out due to patient leaving prior to being seen by health care provider: Secondary | ICD-10-CM | POA: Insufficient documentation

## 2020-03-25 NOTE — ED Triage Notes (Signed)
Left neck pain, left shoulder and left shoulder blade pain for 2 weeks.  Denies injury.

## 2020-03-25 NOTE — ED Notes (Signed)
Pt did not answer 3 x called

## 2020-03-26 ENCOUNTER — Other Ambulatory Visit: Payer: Self-pay

## 2020-03-26 ENCOUNTER — Encounter (HOSPITAL_BASED_OUTPATIENT_CLINIC_OR_DEPARTMENT_OTHER): Payer: Self-pay | Admitting: *Deleted

## 2020-03-26 ENCOUNTER — Emergency Department (HOSPITAL_BASED_OUTPATIENT_CLINIC_OR_DEPARTMENT_OTHER)
Admission: EM | Admit: 2020-03-26 | Discharge: 2020-03-26 | Disposition: A | Discharge status: Home / Self Care | Payer: BC Managed Care – PPO | Attending: Emergency Medicine | Admitting: Emergency Medicine

## 2020-03-26 DIAGNOSIS — M5412 Radiculopathy, cervical region: Secondary | ICD-10-CM | POA: Insufficient documentation

## 2020-03-26 DIAGNOSIS — M25512 Pain in left shoulder: Secondary | ICD-10-CM | POA: Diagnosis not present

## 2020-03-26 MED ORDER — KETOROLAC TROMETHAMINE 60 MG/2ML IM SOLN
60.0000 mg | Freq: Once | INTRAMUSCULAR | Status: AC
  Administered 2020-03-26: 60 mg via INTRAMUSCULAR
  Filled 2020-03-26: qty 2

## 2020-03-26 MED ORDER — PREDNISONE 20 MG PO TABS
40.0000 mg | ORAL_TABLET | Freq: Once | ORAL | Status: AC
  Administered 2020-03-26: 40 mg via ORAL
  Filled 2020-03-26: qty 2

## 2020-03-26 MED ORDER — HYDROCODONE-ACETAMINOPHEN 5-325 MG PO TABS: 1.0000 | ORAL_TABLET | Freq: Four times a day (QID) | ORAL | 0 refills | Status: AC | PRN

## 2020-03-26 MED ORDER — PREDNISONE 10 MG PO TABS: 20.0000 mg | ORAL_TABLET | Freq: Two times a day (BID) | ORAL | 0 refills | Status: AC

## 2020-03-26 NOTE — Discharge Instructions (Addendum)
Wear arm sling as applied for the next several days, then gradually start to reintroduce activity.  Begin taking prednisone as prescribed and hydrocodone as prescribed as needed for pain.  Follow-up with your primary doctor if symptoms or not improving in the next week to discuss physical therapy or possibly imaging studies.

## 2020-03-26 NOTE — ED Triage Notes (Addendum)
Pt c/o left lateral neck pain that started 2 weeks ago. States pain radiates into his elbow. Has taken a muscle relaxer with little relief. Has tried "goody powders, heat, tylenol, without relief.   Denies any injury. C/o muscles type spasm to his left arm as well.

## 2020-03-26 NOTE — ED Provider Notes (Signed)
[facility] EMERGENCY DEPARTMENT Provider Note   CSN: [id number] Arrival date & time: 03/26/20  0447     History Chief Complaint  Patient presents with  . left shoulder pain    Anthony Pham is a 39 y.o. male.  Patient is a 39 year old male with no significant past medical history.  Presents today with complaints of pain in his left shoulder, neck, and left arm.  This is been worsening over the past 2 weeks.  It began in the absence of any injury or trauma.  He has been taking Goody powders and muscle relaxers with little relief.  He denies any numbness or tingling.  He denies any weakness.  The history is provided by the patient.       Past Medical History:  Diagnosis Date  . Bronchitis     Patient Active Problem List   Diagnosis Date Noted  . Left knee pain 03/08/2018    History reviewed. No pertinent surgical history.     No family history on file.  Social History   Tobacco Use  . Smoking status: Never Smoker  . Smokeless tobacco: Never Used  Substance Use Topics  . Alcohol use: Yes    Comment: occasional  . Drug use: No    Home Medications Prior to Admission medications   Medication Sig Start Date End Date Taking? Authorizing Provider  ibuprofen (ADVIL) 800 MG tablet Take 1 tablet (800 mg total) by mouth every 8 (eight) hours as needed. 11/02/19   Terrilee Files, MD  methocarbamol (ROBAXIN) 500 MG tablet Take 1 tablet (500 mg total) by mouth every 8 (eight) hours as needed for muscle spasms. 11/02/19   Terrilee Files, MD    Allergies    Patient has no known allergies.  Review of Systems   Review of Systems  All other systems reviewed and are negative.   Physical Exam Updated Vital Signs BP (!) 157/99 (BP Location: Right Arm)   Pulse 79   Temp 97.7 F (36.5 C) (Oral)   Resp 14   Ht 5\' 10"  (1.778 m)   Wt 104.3 kg   SpO2 94%   BMI 33.00 kg/m   Physical Exam Vitals and nursing note reviewed.  Constitutional:      General: He  is not in acute distress.    Appearance: Normal appearance. He is not ill-appearing.  HENT:     Head: Atraumatic.  Pulmonary:     Effort: Pulmonary effort is normal.  Musculoskeletal:     Comments: The left neck and shoulder appear grossly normal.  There is tenderness in the soft tissues of the left lateral neck extending into the trapezius and down the left shoulder.  He has good range of motion of the shoulder and neck.  Ulnar and radial pulses are easily palpable and motor and sensation are intact throughout the entire hand.  Skin:    General: Skin is warm and dry.  Neurological:     Mental Status: He is alert.     ED Results / Procedures / Treatments   Labs (all labs ordered are listed, but only abnormal results are displayed) Labs Reviewed - No data to display  EKG None  Radiology No results found.  Procedures Procedures (including critical care time)  Medications Ordered in ED Medications  predniSONE (DELTASONE) tablet 40 mg (has no administration in time range)  ketorolac (TORADOL) injection 60 mg (has no administration in time range)    ED Course  I have reviewed the  triage vital signs and the nursing notes.  Pertinent labs & imaging results that were available during my care of the patient were reviewed by me and considered in my medical decision making (see chart for details).    MDM Rules/Calculators/A&P  Patient presenting with left shoulder pain that appears related to some sort of radiculopathy.  Patient will be given prednisone and Toradol here in the ER.  He will be discharged with prednisone and hydrocodone.  He will be placed in an arm sling and advised to rest for the next several days.  If he is not improving in the next 1 to 2 weeks, he should follow-up with his primary doctor to discuss physical therapy or possibly imaging studies.  Final Clinical Impression(s) / ED Diagnoses Final diagnoses:  None    Rx / DC Orders ED Discharge Orders    None        Geoffery Lyons, MD 03/26/20 [phone]

## 2020-03-26 NOTE — ED Notes (Signed)
MD with pt  

## 2020-04-17 DIAGNOSIS — M5412 Radiculopathy, cervical region: Secondary | ICD-10-CM | POA: Diagnosis not present

## 2020-04-17 DIAGNOSIS — M50121 Cervical disc disorder at C4-C5 level with radiculopathy: Secondary | ICD-10-CM | POA: Diagnosis not present

## 2021-01-07 IMAGING — CT CT RENAL STONE PROTOCOL
2 of 4 series · 16 of 46 positions shown, 18 images · non-contrast
Comparison: None.

CLINICAL DATA: Left flank pain since yesterday.

EXAM:
CT ABDOMEN AND PELVIS WITHOUT CONTRAST
TECHNIQUE: Multidetector CT imaging of the abdomen and pelvis was performed
following the standard protocol without IV contrast.

[Series 2: axial st · axial · 0.82mm/px · z∈[-521,+4]mm · 13 of 115 slices shown, 15 images]
[im 5/115  soft-tissue]
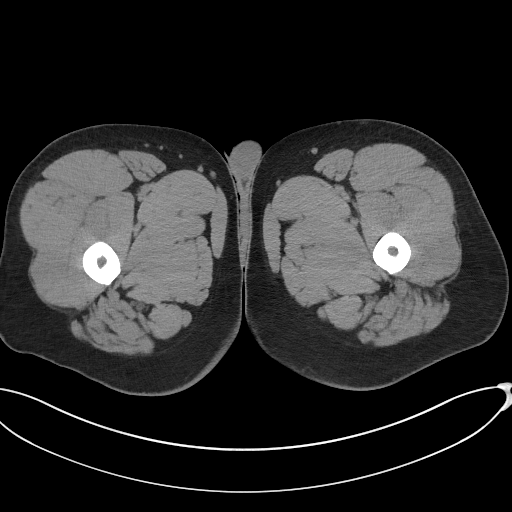
[im 5/115  bone]
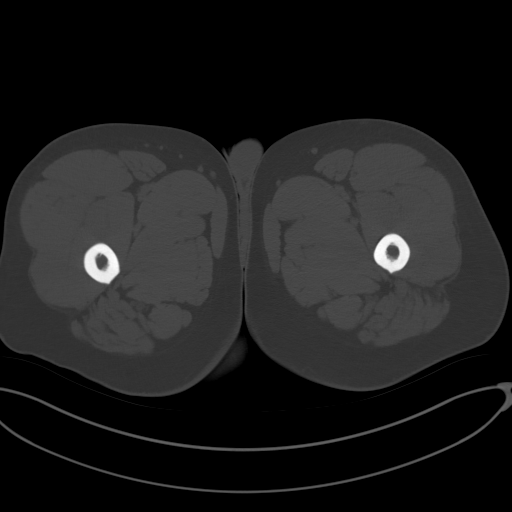
[im 15/115  soft-tissue]
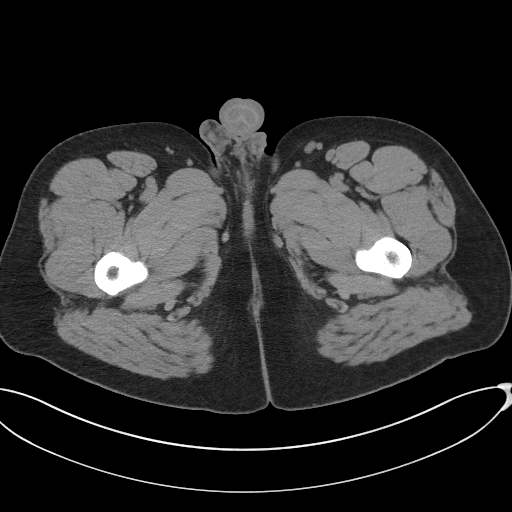
[im 25/115  soft-tissue]
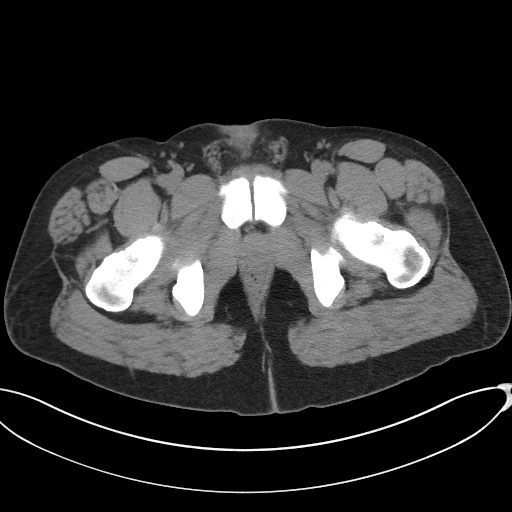
[im 30/115  soft-tissue]
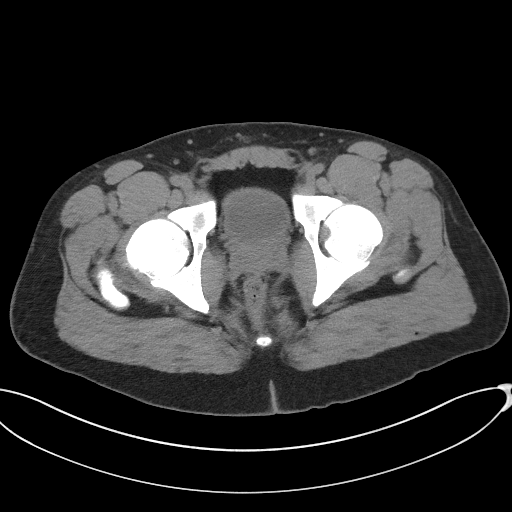
[im 40/115  soft-tissue]
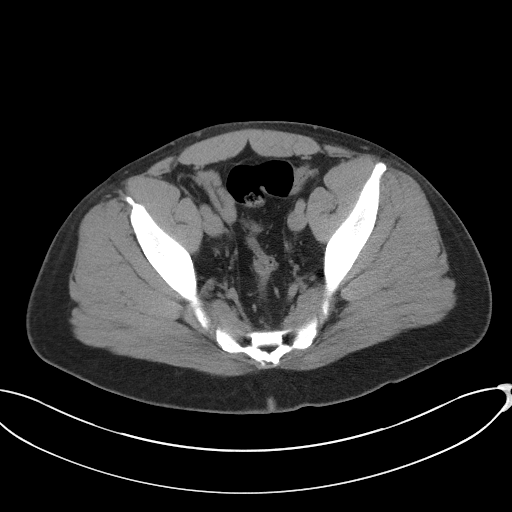
[im 50/115  soft-tissue]
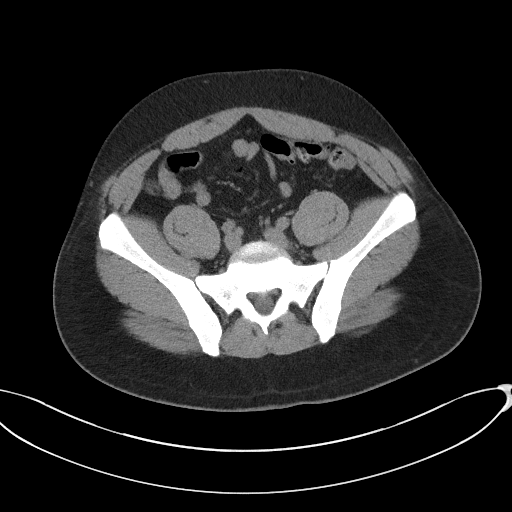
[im 60/115  soft-tissue]
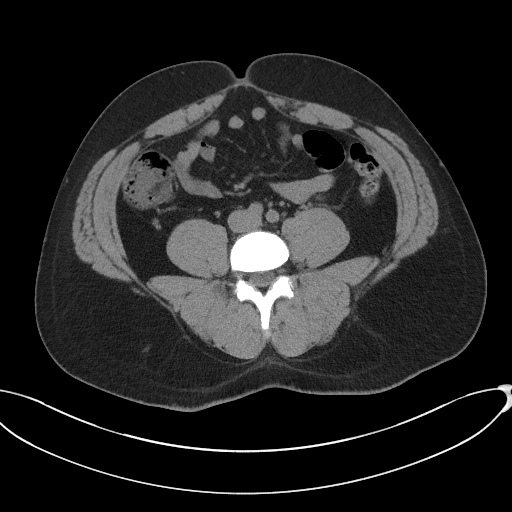
[im 65/115  soft-tissue]
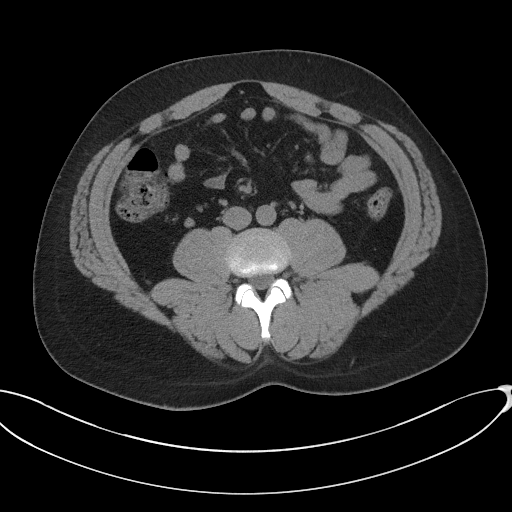
[im 75/115  soft-tissue]
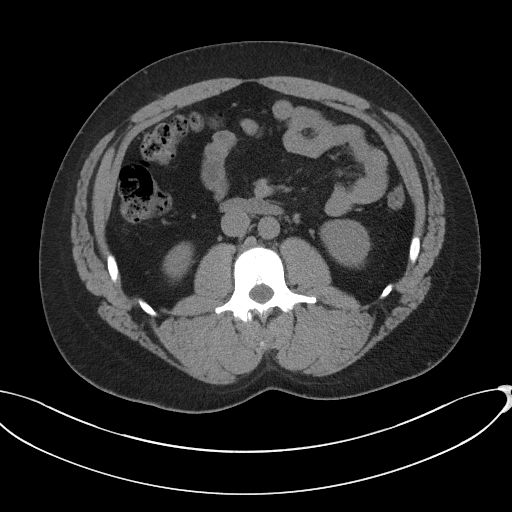
[im 75/115  bone]
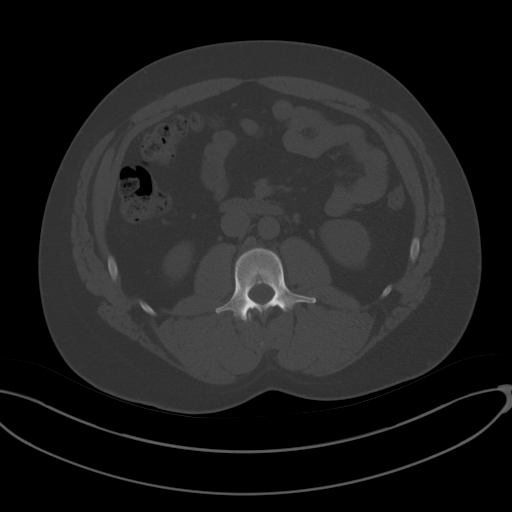
[im 85/115  soft-tissue]
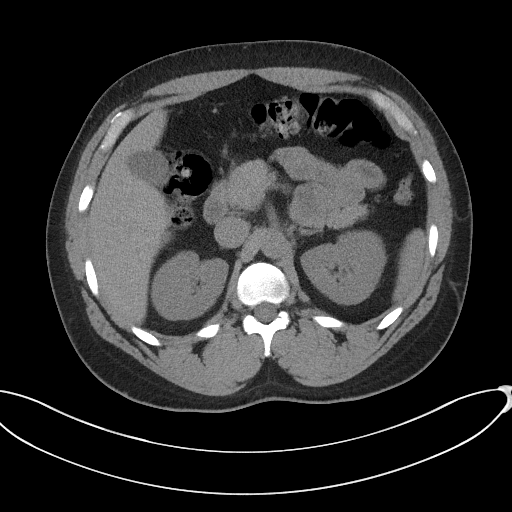
[im 90/115  soft-tissue]
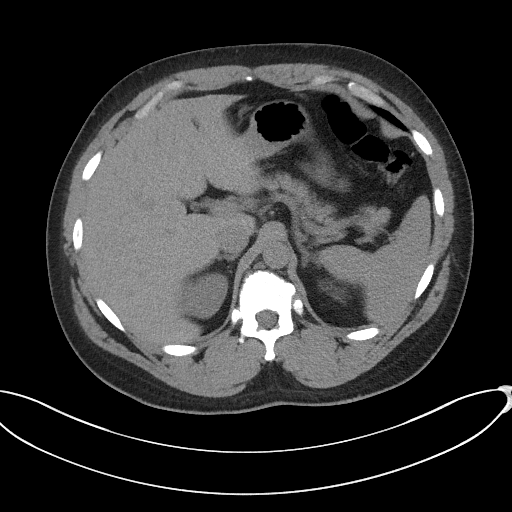
[im 100/115  soft-tissue]
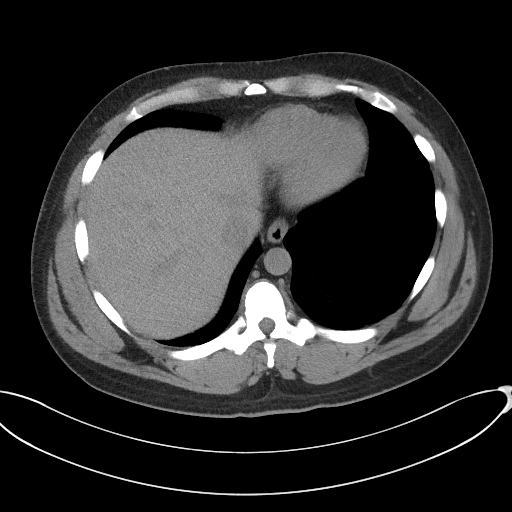
[im 110/115  soft-tissue]
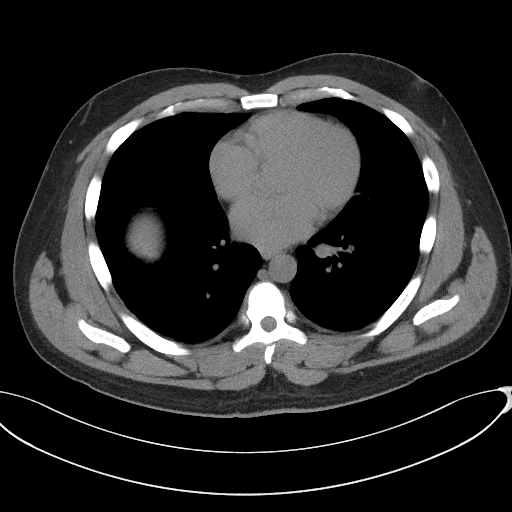

[Series 5: coronal st · coronal · 0.86mm/px · 3 of 103 slices shown]
[im 35/103  soft-tissue]
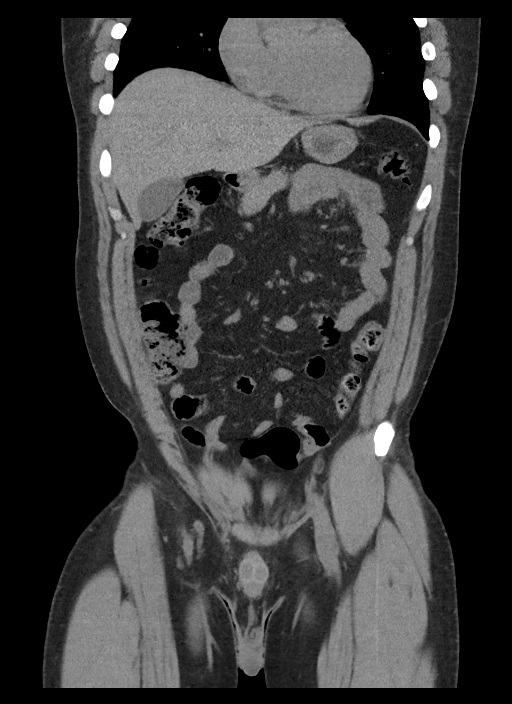
[im 46/103  soft-tissue]
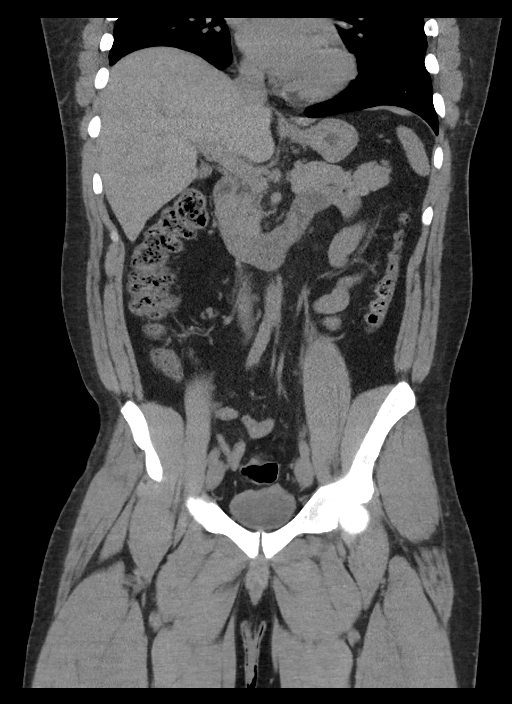
[im 57/103  soft-tissue]
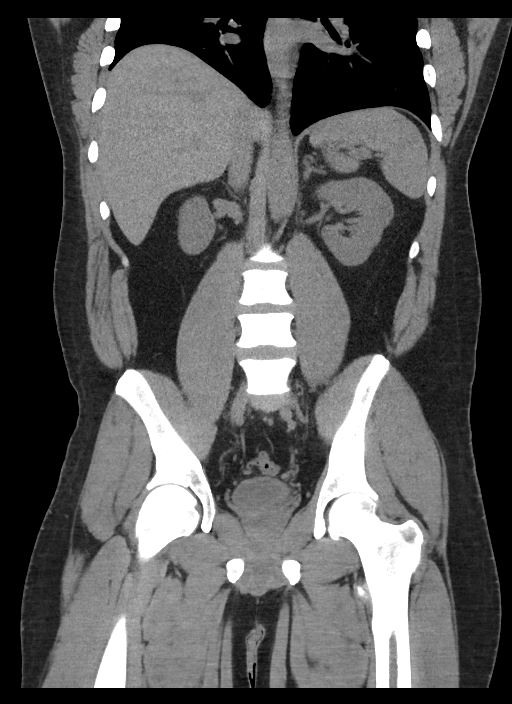

[16 of 46 positions shown; findings below may reference images not displayed]

FINDINGS: Lower chest: Insert lung bases

Hepatobiliary: No focal hepatic lesions or intrahepatic biliary
dilatation. The gallbladder is normal. No common bile duct
dilatation.

Pancreas: No mass, inflammation or ductal dilatation.

Spleen: Normal size. No focal lesions.

Adrenals/Urinary Tract: Adrenal glands and kidneys are unremarkable.
No renal, ureteral or bladder calculi or mass is identified without
contrast. No perinephric inflammatory changes.

Stomach/Bowel: The stomach, duodenum, small bowel and colon are
grossly normal without oral contrast. No inflammatory changes, mass
lesions or obstructive findings. The appendix is normal.

Scattered descending and sigmoid colon diverticulosis but no
findings for acute diverticulitis.

Vascular/Lymphatic: The aorta is normal in caliber. No
atheroscerlotic calcifications. No mesenteric of retroperitoneal
mass or adenopathy. Small scattered lymph nodes are noted.

Reproductive: The prostate gland and seminal vesicles are
unremarkable.

Other: No pelvic mass or adenopathy. No free pelvic fluid
collections. No inguinal mass or adenopathy. No abdominal wall
hernia or subcutaneous lesions.

Musculoskeletal: No significant bony findings.
IMPRESSION: 1. No renal, ureteral or bladder calculi or mass.
2. No acute abdominal/pelvic findings, mass lesions or adenopathy.
3. Descending and sigmoid colon diverticulosis without findings for
acute diverticulitis.

## 2021-02-28 IMAGING — DX DG CHEST 1V PORT
1 series · 1 of 1 positions shown · non-contrast
Comparison: Chest radiograph dated 08/11/2018.

CLINICAL DATA: 38-year-old male with cough and fever.

EXAM:
PORTABLE CHEST 1 VIEW

[chest ap]
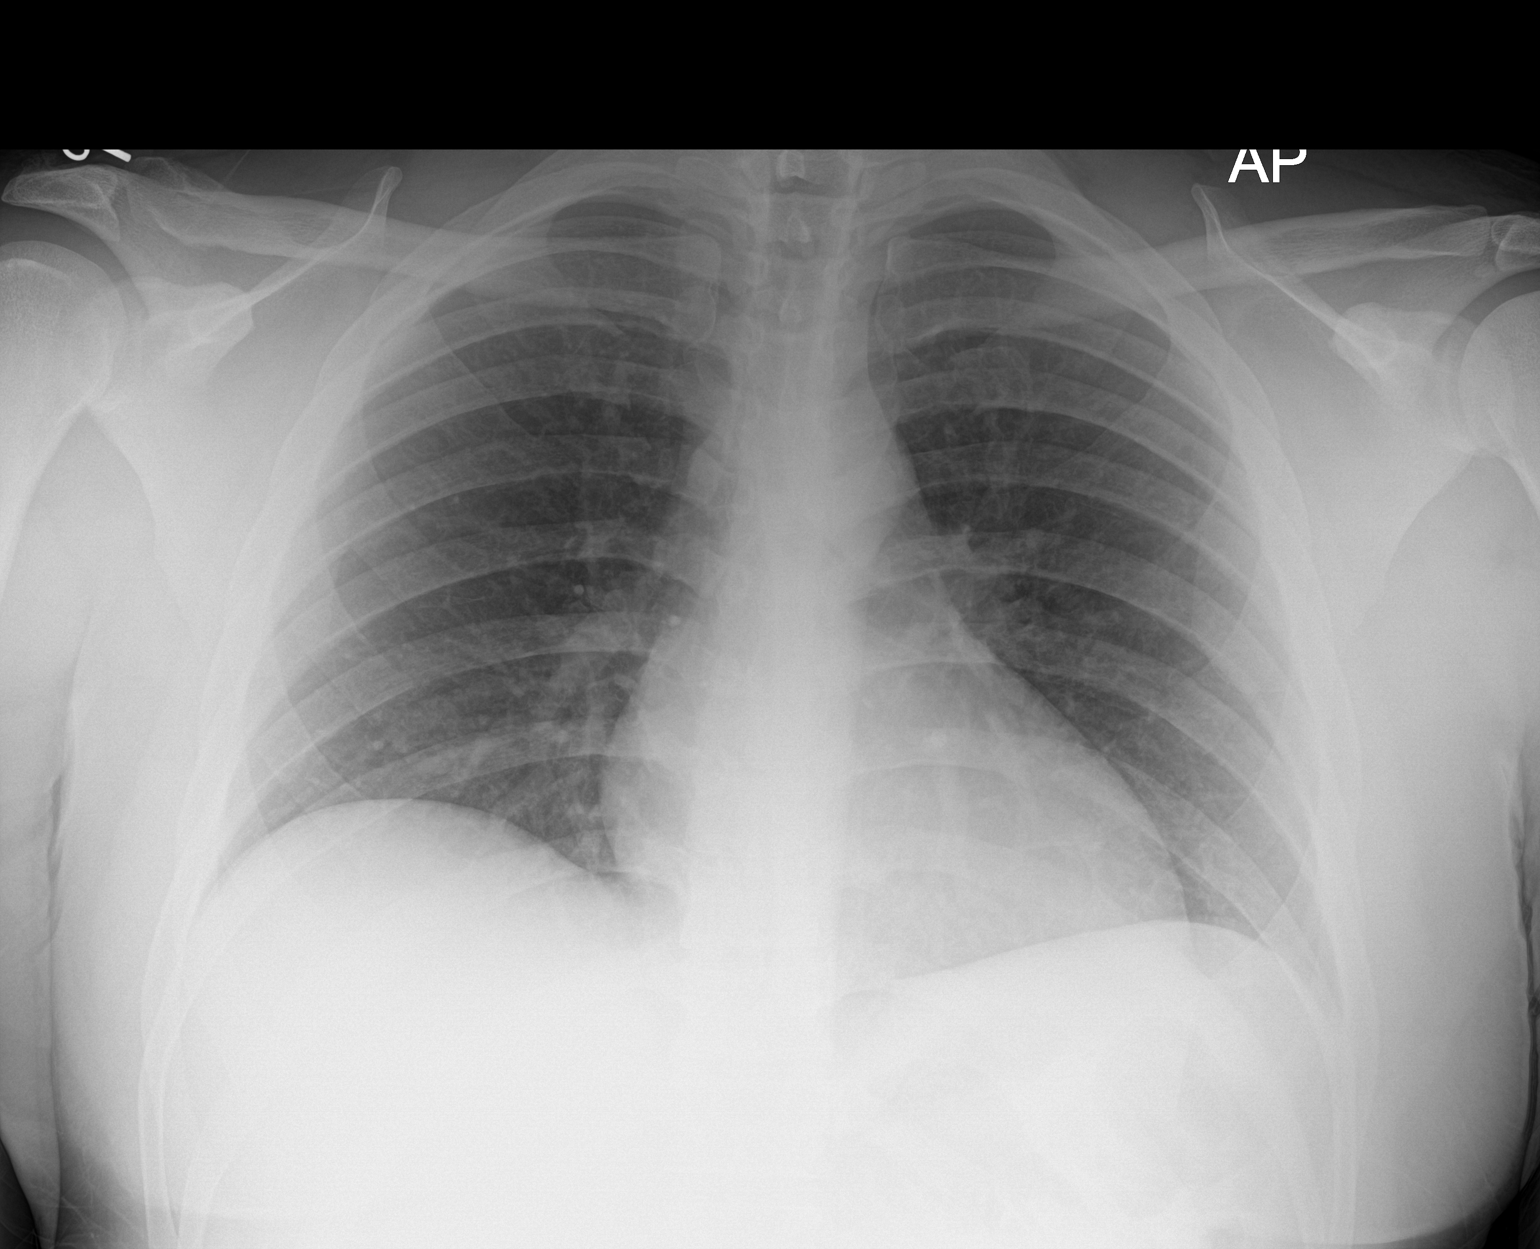

[1 of 1 positions shown; findings below may reference images not displayed]

FINDINGS: The heart size and mediastinal contours are within normal limits.
Both lungs are clear. The visualized skeletal structures are
unremarkable.
IMPRESSION: No active disease.

## 2021-08-27 ENCOUNTER — Emergency Department (HOSPITAL_BASED_OUTPATIENT_CLINIC_OR_DEPARTMENT_OTHER)
Admission: EM | Admit: 2021-08-27 | Discharge: 2021-08-27 | Disposition: A | Discharge status: Home / Self Care | Payer: BC Managed Care – PPO | Attending: Emergency Medicine | Admitting: Emergency Medicine

## 2021-08-27 ENCOUNTER — Other Ambulatory Visit: Payer: Self-pay

## 2021-08-27 ENCOUNTER — Encounter (HOSPITAL_BASED_OUTPATIENT_CLINIC_OR_DEPARTMENT_OTHER): Payer: Self-pay | Admitting: *Deleted

## 2021-08-27 DIAGNOSIS — R000 Tachycardia, unspecified: Secondary | ICD-10-CM | POA: Insufficient documentation

## 2021-08-27 DIAGNOSIS — J101 Influenza due to other identified influenza virus with other respiratory manifestations: Secondary | ICD-10-CM | POA: Insufficient documentation

## 2021-08-27 DIAGNOSIS — Z20822 Contact with and (suspected) exposure to covid-19: Secondary | ICD-10-CM | POA: Insufficient documentation

## 2021-08-27 LAB — RESP PANEL BY RT-PCR (FLU A&B, COVID) ARPGX2
Influenza A by PCR: POSITIVE — AB
Influenza B by PCR: NEGATIVE
SARS Coronavirus 2 by RT PCR: NEGATIVE

## 2021-08-27 MED ORDER — BENZONATATE 100 MG PO CAPS: 100.0000 mg | ORAL_CAPSULE | Freq: Three times a day (TID) | ORAL | 0 refills | Status: AC

## 2021-08-27 NOTE — ED Notes (Addendum)
D/c paperwork reviewed with pt, including prescription.  Pt educated on elevated bp and to f/u with PCP. Pt with no questions or concerns at time of d/c, ambulatory to ED exit without assistance, NAD.

## 2021-08-27 NOTE — ED Provider Notes (Signed)
[facility] EMERGENCY DEPARTMENT Provider Note   CSN: [id number] Arrival date & time: 08/27/21  1507     History  Chief Complaint  Patient presents with   Cough    Anthony Pham is a 41 y.o. male.   Cough Associated symptoms: fever    Patient presents with nonproductive cough x3 days.  Associated with body aches and fever.  He has been taking DayQuil and Sudafed for the fever nasal congestion.  Denies any chest pain or shortness of breath.  Home Medications Prior to Admission medications   Medication Sig Start Date End Date Taking? Authorizing Provider  HYDROcodone-acetaminophen (NORCO) 5-325 MG tablet Take 1-2 tablets by mouth every 6 (six) hours as needed. 03/26/20   Geoffery Lyons, MD  ibuprofen (ADVIL) 800 MG tablet Take 1 tablet (800 mg total) by mouth every 8 (eight) hours as needed. 11/02/19   Terrilee Files, MD  methocarbamol (ROBAXIN) 500 MG tablet Take 1 tablet (500 mg total) by mouth every 8 (eight) hours as needed for muscle spasms. 11/02/19   Terrilee Files, MD  predniSONE (DELTASONE) 10 MG tablet Take 2 tablets (20 mg total) by mouth 2 (two) times daily. 03/26/20   Geoffery Lyons, MD      Allergies    Patient has no known allergies.    Review of Systems   Review of Systems  Constitutional:  Positive for fever.  Respiratory:  Positive for cough.    Physical Exam Updated Vital Signs BP (!) 140/99 (BP Location: Right Arm)    Pulse (!) 103    Temp 100 F (37.8 C) (Oral)    Resp 18    Ht 5\' 11"  (1.803 m)    Wt 108.9 kg    SpO2 95%    BMI 33.47 kg/m  Physical Exam Vitals and nursing note reviewed. Exam conducted with a chaperone present.  Constitutional:      Appearance: Normal appearance.  HENT:     Head: Normocephalic.     Nose: Congestion present.  Eyes:     Extraocular Movements: Extraocular movements intact.     Pupils: Pupils are equal, round, and reactive to light.  Cardiovascular:     Rate and Rhythm: Regular rhythm. Tachycardia present.   Pulmonary:     Effort: Pulmonary effort is normal.     Breath sounds: Normal breath sounds.     Comments: Lungs CTA bilaterally. No accessory muscle use. Speaking in complete sentences.  Abdominal:     General: Abdomen is flat.     Palpations: Abdomen is soft.  Musculoskeletal:     Cervical back: Normal range of motion.  Neurological:     Mental Status: He is alert.  Psychiatric:        Mood and Affect: Mood normal.    ED Results / Procedures / Treatments   Labs (all labs ordered are listed, but only abnormal results are displayed) Labs Reviewed  RESP PANEL BY RT-PCR (FLU A&B, COVID) ARPGX2 - Abnormal; Notable for the following components:      Result Value   Influenza A by PCR POSITIVE (*)    All other components within normal limits    EKG None  Radiology No results found.  Procedures Procedures    Medications Ordered in ED Medications - No data to display  ED Course/ Medical Decision Making/ A&P                           Medical  Decision Making This is a 41 year old male presenting with cough and fever.  He is mildly tachycardic and has noted a temperature but not febrile.  His lungs are clear to auscultation bilaterally, he is not hypoxic or tachypneic.  To be complete sentences, respiratory panel ordered in triage is notable for flu.  Will discharge with Tessalon Perles.  Patient discharged in stable condition.  Amount and/or Complexity of Data Reviewed Labs:  Decision-making details documented in ED Course.  Risk OTC drugs. Prescription drug management.           Final Clinical Impression(s) / ED Diagnoses Final diagnoses:  None    Rx / DC Orders ED Discharge Orders     None         Theron Arista, Cordelia Poche 08/27/21 1955    Vanetta Mulders, MD 09/04/21 [phone]

## 2021-08-27 NOTE — Discharge Instructions (Addendum)
Take Tessalon Perles every 8 hours as needed for cough.  Continue taking Tylenol and Motrin for fever at home.  He can also take DayQuil.

## 2021-08-27 NOTE — ED Triage Notes (Signed)
C/o cough , fever, h/a , body aches, x 3 days

## 2021-08-29 ENCOUNTER — Other Ambulatory Visit: Payer: Self-pay

## 2021-08-29 ENCOUNTER — Emergency Department (HOSPITAL_BASED_OUTPATIENT_CLINIC_OR_DEPARTMENT_OTHER): Payer: Self-pay

## 2021-08-29 ENCOUNTER — Encounter (HOSPITAL_BASED_OUTPATIENT_CLINIC_OR_DEPARTMENT_OTHER): Payer: Self-pay

## 2021-08-29 ENCOUNTER — Emergency Department (HOSPITAL_BASED_OUTPATIENT_CLINIC_OR_DEPARTMENT_OTHER)
Admission: EM | Admit: 2021-08-29 | Discharge: 2021-08-29 | Disposition: A | Discharge status: Home / Self Care | Payer: Self-pay | Attending: Emergency Medicine | Admitting: Emergency Medicine

## 2021-08-29 DIAGNOSIS — J101 Influenza due to other identified influenza virus with other respiratory manifestations: Secondary | ICD-10-CM | POA: Insufficient documentation

## 2021-08-29 MED ORDER — ALBUTEROL SULFATE HFA 108 (90 BASE) MCG/ACT IN AERS
1.0000 | INHALATION_SPRAY | Freq: Once | RESPIRATORY_TRACT | Status: AC
  Administered 2021-08-29: 1 via RESPIRATORY_TRACT
  Filled 2021-08-29: qty 6.7

## 2021-08-29 NOTE — ED Provider Notes (Signed)
[facility] EMERGENCY DEPARTMENT Provider Note   CSN: [id number] Arrival date & time: 08/29/21  1422     History  Chief Complaint  Patient presents with   Shortness of Breath   Cough    Anthony Pham is a 41 y.o. male who presents to ED with concerns of shortness of breath and cough onset yesterday.  He was evaluated in the ED yesterday for similar concerns and diagnosed with influenza A.  He notes that since yesterday his symptoms have started to resolve.  Has associated dry cough, trouble breathing, nasal congestion, body aches. Tried Rx tessalon perles and DayQuil and Sudafed with no relief of his symptoms. Denies chest pain, fever, chills.   The history is provided by the patient. No language interpreter was used.      Home Medications Prior to Admission medications   Medication Sig Start Date End Date Taking? Authorizing Provider  benzonatate (TESSALON) 100 MG capsule Take 1 capsule (100 mg total) by mouth every 8 (eight) hours. 08/27/21   Theron Arista, PA-C  HYDROcodone-acetaminophen (NORCO) 5-325 MG tablet Take 1-2 tablets by mouth every 6 (six) hours as needed. 03/26/20   Geoffery Lyons, MD  ibuprofen (ADVIL) 800 MG tablet Take 1 tablet (800 mg total) by mouth every 8 (eight) hours as needed. 11/02/19   Terrilee Files, MD  methocarbamol (ROBAXIN) 500 MG tablet Take 1 tablet (500 mg total) by mouth every 8 (eight) hours as needed for muscle spasms. 11/02/19   Terrilee Files, MD  predniSONE (DELTASONE) 10 MG tablet Take 2 tablets (20 mg total) by mouth 2 (two) times daily. 03/26/20   Geoffery Lyons, MD      Allergies    Patient has no known allergies.    Review of Systems   Review of Systems  Constitutional:  Negative for chills and fever.  HENT:  Positive for congestion.   Respiratory:  Positive for cough and shortness of breath.   Cardiovascular:  Negative for chest pain.  Musculoskeletal:  Positive for myalgias.  Skin:  Negative for rash.  All other systems  reviewed and are negative.  Physical Exam Updated Vital Signs BP 119/86 (BP Location: Left Leg)    Pulse 79    Temp 98.1 F (36.7 C) (Oral)    Resp 16    Ht 5\' 11"  (1.803 m)    Wt 108.9 kg    SpO2 98%    BMI 33.47 kg/m  Physical Exam Vitals and nursing note reviewed.  Constitutional:      General: He is not in acute distress.    Appearance: He is not diaphoretic.  HENT:     Head: Normocephalic and atraumatic.     Mouth/Throat:     Pharynx: No oropharyngeal exudate.  Eyes:     General: No scleral icterus.    Conjunctiva/sclera: Conjunctivae normal.  Cardiovascular:     Rate and Rhythm: Normal rate and regular rhythm.     Pulses: Normal pulses.     Heart sounds: Normal heart sounds.  Pulmonary:     Effort: Pulmonary effort is normal. No respiratory distress.     Breath sounds: Normal breath sounds. No wheezing.  Chest:     Chest wall: No tenderness.     Comments: No chest wall tenderness to palpation. Abdominal:     General: Bowel sounds are normal.     Palpations: Abdomen is soft. There is no mass.     Tenderness: There is no abdominal tenderness. There is no guarding  or rebound.  Musculoskeletal:        General: Normal range of motion.     Cervical back: Normal range of motion and neck supple.  Skin:    General: Skin is warm and dry.  Neurological:     Mental Status: He is alert.  Psychiatric:        Behavior: Behavior normal.    ED Results / Procedures / Treatments   Labs (all labs ordered are listed, but only abnormal results are displayed) Labs Reviewed - No data to display  EKG EKG Interpretation  Date/Time:  Friday August 29 2021 14:56:52 EST Ventricular Rate:  77 PR Interval:  168 QRS Duration: 86 QT Interval:  382 QTC Calculation: 432 R Axis:   11 Text Interpretation: Normal sinus rhythm Normal ECG When compared with ECG of 11-Aug-2018 07:42, PREVIOUS ECG IS PRESENT Confirmed by Marianna Fuss ([id number]) on 08/29/2021 5:46:03 PM  Radiology DG Chest 2  View  Result Date: 08/29/2021 CLINICAL DATA:  sob/chest discomfort EXAM: CHEST - 2 VIEW COMPARISON:  Dec 24, 2019. FINDINGS: No consolidation. No visible pleural effusions or pneumothorax. Cardiomediastinal silhouette is within normal limits and unchanged. IMPRESSION: No evidence of acute cardiopulmonary disease. Electronically Signed   By: Feliberto Harts M.D.   On: 08/29/2021 16:17    Procedures Procedures    Medications Ordered in ED Medications  albuterol (VENTOLIN HFA) 108 (90 Base) MCG/ACT inhaler 1 puff (1 puff Inhalation Given 08/29/21 1748)    ED Course/ Medical Decision Making/ A&P                           Medical Decision Making  Patient with dry cough and shortness of breath onset yesterday.  Patient was diagnosed with influenza A yesterday.  He has been taking over-the-counter medication and Tessalon Perles for his symptoms.  Patient reports overall his symptoms have been resolving since yesterday.  Denies history of asthma COPD.  Patient vital signs stable, patient afebrile, not tachycardic, not hypoxic. Differential diagnosis includes pneumonia, PTX, influenza A.  On exam patient without acute cardiovascular, pulmonary, abdominal exam without acute findings.  Patient presentation is likely stemming from influenza A etiology.  No acute concerns for pneumonia, PTX at this time.  EKG: EKG personally interpreted by myself, normal sinus rhythm without acute ST/T changes.  Imaging: Chest x-ray visualized and interpreted by myself, notable for no acute cardiopulmonary process.  Dispostion: After consideration of the diagnostic results and the patients response to treatment, I feel that the patent would benefit from discharge home and continue with prescription Tessalon Perles and over-the-counter medications.  Albuterol inhaler given in the ED today.  Patient instructed on its use.  Consult to social work provided to aid with patient obtaining insurance and primary care provider.   Supportive care and strict return precautions discussed with patient.  Patient acknowledges and voices understanding. Appears safe for discharge at this time.  Follow-up as indicated in discharge paperwork.   This chart was dictated using voice recognition software, Dragon. Despite the best efforts of this provider to proofread and correct errors, errors may still occur which can change documentation meaning.  Final Clinical Impression(s) / ED Diagnoses Final diagnoses:  Influenza A    Rx / DC Orders ED Discharge Orders     None         Lativia Velie A, PA-C 08/29/21 1752    Milagros Loll, MD 09/01/21 [phone]

## 2021-08-29 NOTE — Discharge Instructions (Addendum)
It was a pleasure taking care of you today!   Your chest x-ray did not show pneumonia.  You may continue taking the over-the-counter medications that you are taking for your symptoms.  You may continue taking the [facility] as prescribed.  You will be given an albuterol inhaler, take 1 puff if experiencing trouble breathing every 6 hours.  You may follow-up with [facility] for your primary care needs.  You may be contacted by social worker to aid with obtaining insurance.  You may also follow-up with [facility] department as needed.  Return to the ED if you are experiencing increasing/worsening chest pain, trouble breathing, fever, worsening symptoms.

## 2021-08-29 NOTE — ED Triage Notes (Signed)
Patient brought back from triage with complaints of  chest discomfort after being diagnosed with the flu yesterday. Patient states he is not improving after taking his cough medication that was prescribed to him.
